# Patient Record
Sex: Male | Born: 1955 | Race: White | Hispanic: No | Smoking: Former smoker
Health system: Southern US, Community
[De-identification: ages and names within clinical notes are randomized; demographics above are authoritative.]

## PROBLEM LIST (undated history)

## (undated) DIAGNOSIS — I351 Nonrheumatic aortic (valve) insufficiency: Secondary | ICD-10-CM

## (undated) DIAGNOSIS — F1011 Alcohol abuse, in remission: Secondary | ICD-10-CM

## (undated) DIAGNOSIS — I350 Nonrheumatic aortic (valve) stenosis: Secondary | ICD-10-CM

## (undated) DIAGNOSIS — J449 Chronic obstructive pulmonary disease, unspecified: Secondary | ICD-10-CM

## (undated) DIAGNOSIS — I5082 Biventricular heart failure: Secondary | ICD-10-CM

## (undated) HISTORY — PX: OTHER SURGICAL HISTORY: SHX169

## (undated) HISTORY — DX: Biventricular heart failure: I50.82

## (undated) HISTORY — DX: Nonrheumatic aortic (valve) stenosis: I35.0

## (undated) HISTORY — DX: Nonrheumatic aortic (valve) insufficiency: I35.1

## (undated) HISTORY — DX: Chronic obstructive pulmonary disease, unspecified: J44.9

## (undated) HISTORY — DX: Alcohol abuse, in remission: F10.11

---

## 2010-02-27 ENCOUNTER — Emergency Department (HOSPITAL_COMMUNITY)
Admission: EM | Admit: 2010-02-27 | Discharge: 2010-02-27 | Payer: Self-pay | Source: Home / Self Care | Admitting: Emergency Medicine

## 2010-07-27 ENCOUNTER — Inpatient Hospital Stay (HOSPITAL_COMMUNITY)
Admission: EM | Admit: 2010-07-27 | Discharge: 2010-08-10 | Payer: Self-pay | Source: Home / Self Care | Admitting: Emergency Medicine

## 2010-07-27 ENCOUNTER — Ambulatory Visit: Payer: Self-pay | Admitting: Cardiology

## 2010-07-28 ENCOUNTER — Encounter (INDEPENDENT_AMBULATORY_CARE_PROVIDER_SITE_OTHER): Payer: Self-pay | Admitting: Internal Medicine

## 2010-08-01 ENCOUNTER — Ambulatory Visit: Payer: Self-pay | Admitting: Cardiovascular Disease

## 2010-08-02 ENCOUNTER — Other Ambulatory Visit: Payer: Self-pay | Admitting: Internal Medicine

## 2010-08-02 ENCOUNTER — Other Ambulatory Visit: Payer: Self-pay | Admitting: Cardiovascular Disease

## 2010-08-03 ENCOUNTER — Encounter: Payer: Self-pay | Admitting: Cardiology

## 2010-08-03 ENCOUNTER — Ambulatory Visit: Payer: Self-pay | Admitting: Surgery

## 2010-08-03 ENCOUNTER — Other Ambulatory Visit: Payer: Self-pay | Admitting: Cardiovascular Disease

## 2010-08-03 ENCOUNTER — Encounter: Payer: Self-pay | Admitting: Surgery

## 2010-08-04 ENCOUNTER — Other Ambulatory Visit: Payer: Self-pay | Admitting: Cardiovascular Disease

## 2010-08-05 ENCOUNTER — Other Ambulatory Visit: Payer: Self-pay | Admitting: Internal Medicine

## 2010-08-07 ENCOUNTER — Other Ambulatory Visit: Payer: Self-pay | Admitting: Internal Medicine

## 2010-08-08 ENCOUNTER — Other Ambulatory Visit: Payer: Self-pay | Admitting: Cardiovascular Disease

## 2010-08-09 ENCOUNTER — Other Ambulatory Visit: Payer: Self-pay | Admitting: Internal Medicine

## 2010-08-09 ENCOUNTER — Other Ambulatory Visit: Payer: Self-pay | Admitting: Cardiovascular Disease

## 2010-08-10 ENCOUNTER — Other Ambulatory Visit: Payer: Self-pay | Admitting: Cardiovascular Disease

## 2010-08-21 ENCOUNTER — Ambulatory Visit: Payer: Self-pay | Admitting: Cardiology

## 2010-08-21 DIAGNOSIS — J449 Chronic obstructive pulmonary disease, unspecified: Secondary | ICD-10-CM | POA: Insufficient documentation

## 2010-08-21 DIAGNOSIS — J4489 Other specified chronic obstructive pulmonary disease: Secondary | ICD-10-CM | POA: Insufficient documentation

## 2010-08-21 DIAGNOSIS — I359 Nonrheumatic aortic valve disorder, unspecified: Secondary | ICD-10-CM | POA: Insufficient documentation

## 2010-08-21 DIAGNOSIS — I429 Cardiomyopathy, unspecified: Secondary | ICD-10-CM

## 2010-08-22 ENCOUNTER — Encounter: Payer: Self-pay | Admitting: Cardiology

## 2010-08-25 ENCOUNTER — Encounter: Payer: Self-pay | Admitting: Cardiology

## 2010-08-26 LAB — CONVERTED CEMR LAB
BUN: 16 mg/dL (ref 6–23)
CO2: 27 meq/L (ref 19–32)
Calcium: 9.9 mg/dL (ref 8.4–10.5)
Chloride: 94 meq/L — ABNORMAL LOW (ref 96–112)
Creatinine, Ser: 0.91 mg/dL (ref 0.40–1.50)
Glucose, Bld: 124 mg/dL — ABNORMAL HIGH (ref 70–99)
Potassium: 4.7 meq/L (ref 3.5–5.3)
Pro B Natriuretic peptide (BNP): 2632.8 pg/mL — ABNORMAL HIGH (ref 0.0–100.0)
Sodium: 136 meq/L (ref 135–145)

## 2010-08-28 ENCOUNTER — Telehealth (INDEPENDENT_AMBULATORY_CARE_PROVIDER_SITE_OTHER): Payer: Self-pay | Admitting: *Deleted

## 2010-08-28 ENCOUNTER — Encounter: Payer: Self-pay | Admitting: Cardiology

## 2010-09-05 ENCOUNTER — Ambulatory Visit: Payer: Self-pay | Admitting: Cardiology

## 2010-09-08 LAB — CONVERTED CEMR LAB
BUN: 21 mg/dL (ref 6–23)
CO2: 31 meq/L (ref 19–32)
Calcium: 9.5 mg/dL (ref 8.4–10.5)
Chloride: 92 meq/L — ABNORMAL LOW (ref 96–112)
Creatinine, Ser: 1.03 mg/dL (ref 0.40–1.50)
Glucose, Bld: 96 mg/dL (ref 70–99)
Potassium: 4.4 meq/L (ref 3.5–5.3)
Pro B Natriuretic peptide (BNP): 3635.8 pg/mL — ABNORMAL HIGH (ref 0.0–100.0)
Sodium: 133 meq/L — ABNORMAL LOW (ref 135–145)

## 2010-09-14 ENCOUNTER — Ambulatory Visit: Payer: Self-pay | Admitting: Cardiology

## 2010-09-14 DIAGNOSIS — I5022 Chronic systolic (congestive) heart failure: Secondary | ICD-10-CM | POA: Insufficient documentation

## 2010-10-02 ENCOUNTER — Telehealth (INDEPENDENT_AMBULATORY_CARE_PROVIDER_SITE_OTHER): Payer: Self-pay

## 2010-10-02 ENCOUNTER — Encounter: Payer: Self-pay | Admitting: Adult Health

## 2010-10-11 ENCOUNTER — Telehealth (INDEPENDENT_AMBULATORY_CARE_PROVIDER_SITE_OTHER): Payer: Self-pay

## 2010-10-12 DIAGNOSIS — R0602 Shortness of breath: Secondary | ICD-10-CM | POA: Insufficient documentation

## 2010-10-13 ENCOUNTER — Ambulatory Visit (HOSPITAL_COMMUNITY)
Admission: RE | Admit: 2010-10-13 | Discharge: 2010-10-13 | Payer: Self-pay | Source: Home / Self Care | Attending: Cardiology | Admitting: Cardiology

## 2010-10-15 LAB — CONVERTED CEMR LAB
AST: 19 units/L (ref 0–37)
Albumin: 4.5 g/dL (ref 3.5–5.2)
HDL: 41 mg/dL (ref >39)
LDL Cholesterol: 100 mg/dL — ABNORMAL HIGH (ref 0–99)
Total Bilirubin: 1 mg/dL (ref 0.3–1.2)
Total CHOL/HDL Ratio: 4

## 2010-10-16 LAB — CONVERTED CEMR LAB
CO2: 28 meq/L (ref 19–32)
Calcium: 10 mg/dL (ref 8.4–10.5)
Chloride: 94 meq/L — ABNORMAL LOW (ref 96–112)
Eosinophils Relative: 2 % (ref 0–5)
Glucose, Bld: 142 mg/dL — ABNORMAL HIGH (ref 70–99)
HCT: 44.2 % (ref 39.0–52.0)
Hemoglobin: 14.4 g/dL (ref 13.0–17.0)
Lymphocytes Relative: 20 % (ref 12–46)
Lymphs Abs: 1.7 10*3/uL (ref 0.7–4.0)
Neutro Abs: 6 10*3/uL (ref 1.7–7.7)
Platelets: 194 10*3/uL (ref 150–400)
Sodium: 136 meq/L (ref 135–145)
WBC: 8.4 10*3/uL (ref 4.0–10.5)

## 2010-10-20 ENCOUNTER — Encounter (INDEPENDENT_AMBULATORY_CARE_PROVIDER_SITE_OTHER): Payer: Self-pay | Admitting: *Deleted

## 2010-10-20 ENCOUNTER — Ambulatory Visit: Payer: Self-pay | Admitting: Cardiology

## 2010-11-05 HISTORY — PX: AORTIC VALVE REPLACEMENT: SHX41

## 2010-11-13 ENCOUNTER — Encounter: Payer: Self-pay | Admitting: Cardiology

## 2010-11-13 LAB — CONVERTED CEMR LAB
BUN: 21 mg/dL (ref 6–23)
CO2: 24 meq/L (ref 19–32)
Chloride: 96 meq/L (ref 96–112)
Creatinine, Ser: 1.19 mg/dL (ref 0.40–1.50)
Glucose, Bld: 117 mg/dL — ABNORMAL HIGH (ref 70–99)

## 2010-11-14 ENCOUNTER — Emergency Department (HOSPITAL_COMMUNITY)
Admission: EM | Admit: 2010-11-14 | Discharge: 2010-11-15 | Disposition: A | Discharge status: Other Acute Inpatient Hospital | Payer: Self-pay | Source: Home / Self Care | Admitting: Emergency Medicine

## 2010-11-15 ENCOUNTER — Encounter: Payer: Self-pay | Admitting: Cardiology

## 2010-11-15 ENCOUNTER — Inpatient Hospital Stay (HOSPITAL_COMMUNITY)
Admission: EM | Admit: 2010-11-15 | Discharge: 2010-11-18 | Disposition: A | Discharge status: Other Acute Inpatient Hospital | Payer: Self-pay | Attending: Cardiovascular Disease | Admitting: Cardiovascular Disease

## 2010-11-20 LAB — URINALYSIS, ROUTINE W REFLEX MICROSCOPIC
Bilirubin Urine: NEGATIVE
Hgb urine dipstick: NEGATIVE
Ketones, ur: NEGATIVE mg/dL
Leukocytes, UA: NEGATIVE
Nitrite: NEGATIVE
Specific Gravity, Urine: 1.005 (ref 1.005–1.030)
Urine Glucose, Fasting: NEGATIVE mg/dL
Urobilinogen, UA: 0.2 mg/dL (ref 0.0–1.0)
pH: 5 (ref 5.0–8.0)

## 2010-11-20 LAB — COMPREHENSIVE METABOLIC PANEL
ALT: 116 U/L — ABNORMAL HIGH (ref 0–53)
ALT: 156 U/L — ABNORMAL HIGH (ref 0–53)
ALT: 326 U/L — ABNORMAL HIGH (ref 0–53)
ALT: 232 U/L — ABNORMAL HIGH (ref 0–53)
ALT: 295 U/L — ABNORMAL HIGH (ref 0–53)
AST: 110 U/L — ABNORMAL HIGH (ref 0–37)
AST: 156 U/L — ABNORMAL HIGH (ref 0–37)
AST: 290 U/L — ABNORMAL HIGH (ref 0–37)
AST: 125 U/L — ABNORMAL HIGH (ref 0–37)
AST: 181 U/L — ABNORMAL HIGH (ref 0–37)
Albumin: 4.3 g/dL (ref 3.5–5.2)
Albumin: 4 g/dL (ref 3.5–5.2)
Albumin: 4.2 g/dL (ref 3.5–5.2)
Albumin: 3.3 g/dL — ABNORMAL LOW (ref 3.5–5.2)
Albumin: 3.7 g/dL (ref 3.5–5.2)
Alkaline Phosphatase: 63 U/L (ref 39–117)
Alkaline Phosphatase: 58 U/L (ref 39–117)
Alkaline Phosphatase: 58 U/L (ref 39–117)
Alkaline Phosphatase: 51 U/L (ref 39–117)
Alkaline Phosphatase: 62 U/L (ref 39–117)
BUN: 44 mg/dL — ABNORMAL HIGH (ref 6–23)
BUN: 40 mg/dL — ABNORMAL HIGH (ref 6–23)
BUN: 44 mg/dL — ABNORMAL HIGH (ref 6–23)
BUN: 13 mg/dL (ref 6–23)
BUN: 19 mg/dL (ref 6–23)
CO2: 25 mEq/L (ref 19–32)
CO2: 22 mEq/L (ref 19–32)
CO2: 22 mEq/L (ref 19–32)
CO2: 27 mEq/L (ref 19–32)
CO2: 28 mEq/L (ref 19–32)
Calcium: 10 mg/dL (ref 8.4–10.5)
Calcium: 9.1 mg/dL (ref 8.4–10.5)
Calcium: 9.3 mg/dL (ref 8.4–10.5)
Calcium: 8.1 mg/dL — ABNORMAL LOW (ref 8.4–10.5)
Calcium: 8.7 mg/dL (ref 8.4–10.5)
Chloride: 92 mEq/L — ABNORMAL LOW (ref 96–112)
Chloride: 93 mEq/L — ABNORMAL LOW (ref 96–112)
Chloride: 95 mEq/L — ABNORMAL LOW (ref 96–112)
Chloride: 89 mEq/L — ABNORMAL LOW (ref 96–112)
Chloride: 97 mEq/L (ref 96–112)
Creatinine, Ser: 1.41 mg/dL (ref 0.4–1.5)
Creatinine, Ser: 1.58 mg/dL — ABNORMAL HIGH (ref 0.4–1.5)
Creatinine, Ser: 1.58 mg/dL — ABNORMAL HIGH (ref 0.4–1.5)
Creatinine, Ser: 0.86 mg/dL (ref 0.4–1.5)
Creatinine, Ser: 0.97 mg/dL (ref 0.4–1.5)
GFR calc Af Amer: >60 mL/min (ref >60)
GFR calc Af Amer: 56 mL/min — ABNORMAL LOW (ref >60)
GFR calc Af Amer: 56 mL/min — ABNORMAL LOW (ref >60)
GFR calc Af Amer: >60 mL/min (ref >60)
GFR calc Af Amer: >60 mL/min (ref >60)
GFR calc non Af Amer: 52 mL/min — ABNORMAL LOW (ref >60)
GFR calc non Af Amer: 46 mL/min — ABNORMAL LOW (ref >60)
GFR calc non Af Amer: 46 mL/min — ABNORMAL LOW (ref >60)
GFR calc non Af Amer: >60 mL/min (ref >60)
GFR calc non Af Amer: >60 mL/min (ref >60)
Glucose, Bld: 96 mg/dL (ref 70–99)
Glucose, Bld: 111 mg/dL — ABNORMAL HIGH (ref 70–99)
Glucose, Bld: 86 mg/dL (ref 70–99)
Glucose, Bld: 341 mg/dL — ABNORMAL HIGH (ref 70–99)
Glucose, Bld: 99 mg/dL (ref 70–99)
Potassium: 5.7 mEq/L — ABNORMAL HIGH (ref 3.5–5.1)
Potassium: 3 mEq/L — ABNORMAL LOW (ref 3.5–5.1)
Potassium: 5.8 mEq/L — ABNORMAL HIGH (ref 3.5–5.1)
Potassium: 3.1 mEq/L — ABNORMAL LOW (ref 3.5–5.1)
Potassium: 3.5 mEq/L (ref 3.5–5.1)
Sodium: 129 mEq/L — ABNORMAL LOW (ref 135–145)
Sodium: 128 mEq/L — ABNORMAL LOW (ref 135–145)
Sodium: 128 mEq/L — ABNORMAL LOW (ref 135–145)
Sodium: 126 mEq/L — ABNORMAL LOW (ref 135–145)
Sodium: 135 mEq/L (ref 135–145)
Total Bilirubin: 2.4 mg/dL — ABNORMAL HIGH (ref 0.3–1.2)
Total Bilirubin: 1.9 mg/dL — ABNORMAL HIGH (ref 0.3–1.2)
Total Bilirubin: 2.7 mg/dL — ABNORMAL HIGH (ref 0.3–1.2)
Total Bilirubin: 1.9 mg/dL — ABNORMAL HIGH (ref 0.3–1.2)
Total Bilirubin: 2 mg/dL — ABNORMAL HIGH (ref 0.3–1.2)
Total Protein: 6.6 g/dL (ref 6.0–8.3)
Total Protein: 6.3 g/dL (ref 6.0–8.3)
Total Protein: 6.6 g/dL (ref 6.0–8.3)
Total Protein: 5.7 g/dL — ABNORMAL LOW (ref 6.0–8.3)
Total Protein: 6 g/dL (ref 6.0–8.3)

## 2010-11-20 LAB — CBC
HCT: 41.2 % (ref 39.0–52.0)
HCT: 41.3 % (ref 39.0–52.0)
HCT: 42 % (ref 39.0–52.0)
HCT: 37.5 % — ABNORMAL LOW (ref 39.0–52.0)
Hemoglobin: 14.4 g/dL (ref 13.0–17.0)
Hemoglobin: 14.3 g/dL (ref 13.0–17.0)
Hemoglobin: 14.6 g/dL (ref 13.0–17.0)
Hemoglobin: 13 g/dL (ref 13.0–17.0)
MCH: 31.1 pg (ref 26.0–34.0)
MCH: 31.2 pg (ref 26.0–34.0)
MCH: 31.3 pg (ref 26.0–34.0)
MCH: 31.5 pg (ref 26.0–34.0)
MCHC: 35 g/dL (ref 30.0–36.0)
MCHC: 34.6 g/dL (ref 30.0–36.0)
MCHC: 34.8 g/dL (ref 30.0–36.0)
MCHC: 34.7 g/dL (ref 30.0–36.0)
MCV: 89 fL (ref 78.0–100.0)
MCV: 90.1 fL (ref 78.0–100.0)
MCV: 90.2 fL (ref 78.0–100.0)
MCV: 90.8 fL (ref 78.0–100.0)
Platelets: 195 10*3/uL (ref 150–400)
Platelets: 175 10*3/uL (ref 150–400)
Platelets: 186 10*3/uL (ref 150–400)
Platelets: 159 10*3/uL (ref 150–400)
RBC: 4.63 MIL/uL (ref 4.22–5.81)
RBC: 4.58 MIL/uL (ref 4.22–5.81)
RBC: 4.66 MIL/uL (ref 4.22–5.81)
RBC: 4.13 MIL/uL — ABNORMAL LOW (ref 4.22–5.81)
RDW: 16.8 % — ABNORMAL HIGH (ref 11.5–15.5)
RDW: 16.8 % — ABNORMAL HIGH (ref 11.5–15.5)
RDW: 16.9 % — ABNORMAL HIGH (ref 11.5–15.5)
RDW: 17.4 % — ABNORMAL HIGH (ref 11.5–15.5)
WBC: 10.7 10*3/uL — ABNORMAL HIGH (ref 4.0–10.5)
WBC: 12.1 10*3/uL — ABNORMAL HIGH (ref 4.0–10.5)
WBC: 12.2 10*3/uL — ABNORMAL HIGH (ref 4.0–10.5)
WBC: 9.7 10*3/uL (ref 4.0–10.5)

## 2010-11-20 LAB — DIFFERENTIAL
Basophils Absolute: 0.1 10*3/uL (ref 0.0–0.1)
Basophils Absolute: 0 10*3/uL (ref 0.0–0.1)
Basophils Relative: 1 % (ref 0–1)
Basophils Relative: 0 % (ref 0–1)
Eosinophils Absolute: 0.1 10*3/uL (ref 0.0–0.7)
Eosinophils Absolute: 0.1 10*3/uL (ref 0.0–0.7)
Eosinophils Relative: 1 % (ref 0–5)
Eosinophils Relative: 0 % (ref 0–5)
Lymphocytes Relative: 16 % (ref 12–46)
Lymphocytes Relative: 11 % — ABNORMAL LOW (ref 12–46)
Lymphs Abs: 1.7 10*3/uL (ref 0.7–4.0)
Lymphs Abs: 1.3 10*3/uL (ref 0.7–4.0)
Monocytes Absolute: 1.1 10*3/uL — ABNORMAL HIGH (ref 0.1–1.0)
Monocytes Absolute: 1.3 10*3/uL — ABNORMAL HIGH (ref 0.1–1.0)
Monocytes Relative: 10 % (ref 3–12)
Monocytes Relative: 11 % (ref 3–12)
Neutro Abs: 7.7 10*3/uL (ref 1.7–7.7)
Neutro Abs: 9.5 10*3/uL — ABNORMAL HIGH (ref 1.7–7.7)
Neutrophils Relative %: 73 % (ref 43–77)
Neutrophils Relative %: 78 % — ABNORMAL HIGH (ref 43–77)

## 2010-11-20 LAB — BASIC METABOLIC PANEL
BUN: 43 mg/dL — ABNORMAL HIGH (ref 6–23)
BUN: 26 mg/dL — ABNORMAL HIGH (ref 6–23)
CO2: 23 mEq/L (ref 19–32)
CO2: 26 mEq/L (ref 19–32)
Calcium: 9.6 mg/dL (ref 8.4–10.5)
Calcium: 8.7 mg/dL (ref 8.4–10.5)
Chloride: 94 mEq/L — ABNORMAL LOW (ref 96–112)
Chloride: 95 mEq/L — ABNORMAL LOW (ref 96–112)
Creatinine, Ser: 1.63 mg/dL — ABNORMAL HIGH (ref 0.4–1.5)
Creatinine, Ser: 1.16 mg/dL (ref 0.4–1.5)
GFR calc Af Amer: 54 mL/min — ABNORMAL LOW (ref >60)
GFR calc Af Amer: >60 mL/min (ref >60)
GFR calc non Af Amer: 44 mL/min — ABNORMAL LOW (ref >60)
GFR calc non Af Amer: >60 mL/min (ref >60)
Glucose, Bld: 67 mg/dL — ABNORMAL LOW (ref 70–99)
Glucose, Bld: 115 mg/dL — ABNORMAL HIGH (ref 70–99)
Potassium: 4.7 mEq/L (ref 3.5–5.1)
Potassium: 3.3 mEq/L — ABNORMAL LOW (ref 3.5–5.1)
Sodium: 130 mEq/L — ABNORMAL LOW (ref 135–145)
Sodium: 132 mEq/L — ABNORMAL LOW (ref 135–145)

## 2010-11-20 LAB — HEPATITIS PANEL, ACUTE
HCV Ab: NEGATIVE
Hep A IgM: NEGATIVE
Hep B C IgM: NEGATIVE
Hepatitis B Surface Ag: NEGATIVE

## 2010-11-20 LAB — GLUCOSE, CAPILLARY: Glucose-Capillary: 115 mg/dL — ABNORMAL HIGH (ref 70–99)

## 2010-11-20 LAB — SODIUM, URINE, RANDOM: Sodium, Ur: 69 mEq/L

## 2010-11-20 LAB — AMMONIA: Ammonia: 49 umol/L — ABNORMAL HIGH (ref 11–35)

## 2010-11-20 LAB — CARDIAC PANEL(CRET KIN+CKTOT+MB+TROPI)
CK, MB: 1.4 ng/mL (ref 0.3–4.0)
CK, MB: 1.6 ng/mL (ref 0.3–4.0)
Relative Index: INVALID (ref 0.0–2.5)
Relative Index: INVALID (ref 0.0–2.5)
Total CK: 40 U/L (ref 7–232)
Total CK: 34 U/L (ref 7–232)
Troponin I: 0.04 ng/mL (ref 0.00–0.06)
Troponin I: 0.04 ng/mL (ref 0.00–0.06)

## 2010-11-20 LAB — URINE MICROSCOPIC-ADD ON

## 2010-11-20 LAB — LIPASE, BLOOD: Lipase: 19 U/L (ref 11–59)

## 2010-11-20 LAB — TSH: TSH: 2.753 u[IU]/mL (ref 0.350–4.500)

## 2010-11-20 LAB — LIPID PANEL
Cholesterol: 94 mg/dL (ref 0–200)
HDL: 27 mg/dL — ABNORMAL LOW (ref >39)
LDL Cholesterol: 57 mg/dL (ref 0–99)
Total CHOL/HDL Ratio: 3.5 RATIO
Triglycerides: 48 mg/dL (ref <150)
VLDL: 10 mg/dL (ref 0–40)

## 2010-11-20 LAB — CK TOTAL AND CKMB (NOT AT ARMC)
CK, MB: 1.5 ng/mL (ref 0.3–4.0)
Relative Index: INVALID (ref 0.0–2.5)
Total CK: 45 U/L (ref 7–232)

## 2010-11-20 LAB — MAGNESIUM
Magnesium: 2 mg/dL (ref 1.5–2.5)
Magnesium: 2.1 mg/dL (ref 1.5–2.5)
Magnesium: 2.2 mg/dL (ref 1.5–2.5)

## 2010-11-20 LAB — CREATININE, URINE, RANDOM: Creatinine, Urine: 25 mg/dL

## 2010-11-20 LAB — LACTIC ACID, PLASMA: Lactic Acid, Venous: 1.2 mmol/L (ref 0.5–2.2)

## 2010-11-20 LAB — ACETAMINOPHEN LEVEL: Acetaminophen (Tylenol), Serum: <10 ug/mL — ABNORMAL LOW (ref 10–30)

## 2010-11-20 LAB — CORTISOL: Cortisol, Plasma: 40.1 ug/dL

## 2010-11-20 LAB — MRSA PCR SCREENING: MRSA by PCR: NEGATIVE

## 2010-11-20 LAB — PHOSPHORUS: Phosphorus: 4.2 mg/dL (ref 2.3–4.6)

## 2010-11-20 LAB — PROTIME-INR
INR: 1.75 — ABNORMAL HIGH (ref 0.00–1.49)
Prothrombin Time: 20.6 seconds — ABNORMAL HIGH (ref 11.6–15.2)

## 2010-11-20 LAB — TROPONIN I: Troponin I: 0.04 ng/mL (ref 0.00–0.06)

## 2010-11-20 LAB — OSMOLALITY, URINE: Osmolality, Ur: 322 mOsm/kg — ABNORMAL LOW (ref 390–1090)

## 2010-11-22 ENCOUNTER — Encounter (INDEPENDENT_AMBULATORY_CARE_PROVIDER_SITE_OTHER): Payer: Self-pay | Admitting: *Deleted

## 2010-11-22 ENCOUNTER — Ambulatory Visit: Admit: 2010-11-22 | Payer: Self-pay | Admitting: Cardiology

## 2010-11-22 LAB — CULTURE, BLOOD (ROUTINE X 2)
Culture  Setup Time: 201201111547
Culture  Setup Time: 201201111548
Culture: NO GROWTH
Culture: NO GROWTH

## 2010-11-27 NOTE — Consult Note (Addendum)
NAMEJASMINE, MACEACHERN NO.:  [id number]  MEDICAL RECORD NO.:  [id number]          PATIENT TYPE:  INP  LOCATION:  2503                         FACILITY:  [facility]  PHYSICIAN:  Vesta Mixer, M.D. DATE OF BIRTH:  04-09-1956  DATE OF CONSULTATION:  11/15/2010 DATE OF DISCHARGE:                                CONSULTATION   PRIMARY CARDIOLOGIST:  Jonelle Sidle, MD  PRIMARY CARE PROVIDER:  The patient has been referred to [facility] but has not yet followed up.  HISTORY OF PRESENT ILLNESS:  Mr. Sweeden is a 55 year old gentleman whom we first became familiar was in September 2011 when he came in with shortness of breath.  He was diagnosed with the nonischemic cardiomyopathy at that time with an EF of 15% by TEE and 20% by cath. Cardiac cath revealed no severe coronary artery disease.  Discharge weight was 59.2 kg.  He also had moderately severe bicuspid aortic stenosis and was seen in consultation by Dr. Laneta Simmers and felt high risk for surgery, and turned down.  He has a history of significant alcohol use but states he quit after diagnosis of heart disease.  He presents to [facility] with 4-5 day progressive abdominal pain/fullness as well as decreased appetite and nonbloody nausea and vomiting.  He had not noted any change in weight but does not weigh himself consistently. He also notes an increase in shortness of breath and dyspnea on exertion with most activities.  He has been compliant with his medications and tries to avoid salt in his diet.  He had some chest pressure yesterday. He also endorses decreased urine output over the past few days and is on Lasix 80 mg p.o. b.i.d. at home.  He has multiple laboratory abnormalities on admission including hyponatremia, BUN, and creatinine, they are elevated up to 1.63 today, elevated LFTs, elevated ammonia.  He had negative cardiac enzymes.  Initial plain films indicated that  there was a small to moderate right pleural effusion and CT of abdomen and pelvis suggest moderate ascites, increased on prior study.  He went down for a paracentesis today but initial ultrasound of the abdomen showed small amount of ascites in all 4 quadrants and therefore there was insufficient peritoneal fluid to perform a paracentesis.  He was empirically started by the primary team on Rocephin for question of SVT. Most of his cardiac medications have been on hold today secondary to hypotension.  Blood pressure earlier was 83/51 and his last pressure was 118/53.  PAST MEDICAL HISTORY: 1. 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 2. No significant coronary artery disease by cath August 03, 2010,     some emphysema. 3. Prior history of alcohol abuse. 4. Prior history of tobacco abuse. 5. Moderately severe bicuspid aortic stenosis with an AV of 1.23 cm2     by TE with a gradient of 23 mmHg.  He also has mild TR and mild MR  and moderate AI. 6. Hypotension.  PAST SURGICAL HISTORY:  Remotely he states he had some sort of knots removed from his chest pain.  INPATIENT MEDICATIONS:  Aspirin 325 mg daily, Rocephin 1 gram IV q.24 h., vitamin B12, folic acid 1 mg daily, NovoLog/Kayexalate/calcium gluconate for elevated potassium of 5.8 on admission with a recheck of 4.7, multivitamin, thiamine 100 mg daily.  OUTPATIENT MEDICATIONS:  Lisinopril 2.5 mg a day, Spiriva 18 mcg inhaled daily, Coreg 3.125 mg b.i.d., Lasix 80 mg b.i.d., spironolactone 25 mg a day, multivitamin, aspirin 325 mg daily, potassium chloride 20 mEq daily, folic acid, thiamine, vitamin B12.  ALLERGIES:  No known drug allergies.  SOCIAL HISTORY:  Mr. Colasanti lives in [place] by himself.  He has no children.  He quit smoking approximately 1 year ago and denies any alcohol use since  08/07/2010.  He smokes marijuana occasionally but denies any cocaine, heroin, or methamphetamines.  FAMILY HISTORY:  Mother died of an old age of pneumonia.  Father died of cancer.  He had a brother that had a history of MI.  REVIEW OF SYSTEMS:  Positive for shortness of breath and dyspnea on exertion.  No lower extremity edema, palpitations, syncope, presyncope, cough, bright red blood per rectum, melena, hematemesis.  The HPI for pertinent positives.  All other systems reviewed and otherwise negative.  LABORATORY DATA:  WBC 12.2, hemoglobin 14.3, hematocrit 41.3, platelet count 175,000.  Sodium 130, potassium 4.7, chloride 95, CO2 23, glucose 67 this morning, BUN 43, creatinine 1.3, total bilirubin 2.7, alk phos 58, AST 156, ALT 166, total protein 6.6, albumin 4.2.  Cardiac enzymes negative x3.  PT is elevated at 20.6 and INR is elevated at 1.75, lipase is 19.  Ammonia is elevated at 49 with a lactic acid of 1.2, magnesium 2.0, total cholesterol 94, triglycerides 48, HDL 27, LDL 57.  STUDIES: 1. Abdominal ultrasound 12/05/10 showed small amount of     ascites in all 4 quadrants and paracentesis is not performed. 2. Chest x-ray December 05, 2010 showed small to moderate right pleural     effusions and basilar airspace disease.  Cardiomegaly without     edema. 3. CT abdomen and pelvis, 12-05-10 showed moderate ascites,     increased from prior studies.  Cardiomegaly with decrease in     moderate right pleural lesion.  Mild bilateral renal cortical     atrophy.  EKG:  Normal sinus rhythm with ST changes in V2-V3 which is similar from prior EKG with T-wave inversion noted in V5-V6 which is slightly more pronounced than Aug 07, 2010.  PHYSICAL EXAMINATION:  VITAL SIGNS:  Temperature 97.8, pulse 88, respirations 17, blood pressure 83/51 as well as recheck of 118 systolic, pulse ox 95% on 2 liters. GENERAL:  This is a pleasant well-nourished appearing white male  in no acute distress. HEENT:  Normocephalic, atraumatic with extraocular movements intact. Sclerae and nares without discharge.  Dentition is poor. NECK:  Supple with elevated JVP to 10-15 cm. HEART:  Auscultation of heart reveals a 2/6 systolic ejection murmur with a soft diastolic murmur. LUNGS:  Generally clear throughout without wheezes, rales, or rhonchi. ABDOMEN:  Soft, round, nontender with normoactive bowel sounds. EXTREMITIES:  Warm, dry, and without edema.  He has 1+ pedal pulses and 2+ radial pulses. NEUROLOGICAL:  He is alert and oriented x3 and responds to questions appropriately with a normal affect.  IMPRESSION/PLAN:  The patient was seen and examined by Dr. Elease Hashimoto and myself in  unit 2500.  This is a 55 year old gentleman with a history of nonischemic cardiomyopathy with an EF of 15-20% with moderately severe bicuspid aortic stenosis and was previously turned down for aortic valve replacement.  He presents today with abdominal pain and shortness of breath with elevated LFTs, ammonia, and coag.  Examination revealed signs of right heart failure, although this is limited to his abdomen and jugular venous pressure.  He has no lower extremity edema.  His lungs are fairly clear.  Cardiac enzymes have been negative.  His renal function has continued to decline since he initially presented to the ER last night.  He has been empirically started on Rocephin for question of peritonitis and has a white blood cell count of 12.2.  He has some features of hypoperfusion including his declining renal function.  We feel all these are most likely related to hepatic congestion at this time.  Unfortunately, his blood pressure has limited readdition of many of his cardiac medication.  At this time, we will transfer him to the unit and initiate milrinone to see if we can improve his cardiac output.  His blood pressure will be need to be reassessed for the addition of diuretics. We would hold  off on ACE inhibitor and spironolactone now until his kidney function improves.  His beta-blocker may be added back if his blood pressure remained stable.  Overall we feel he has a fairly poor prognosis.  We will continue to follow.  Thank you for the opportunity to participate in care of this patient.     Ronie Spies, P.A.C.   ______________________________ Vesta Mixer, M.D.    DD/MEDQ  D:  11/15/2010  T:  11/16/2010  Job:  [id number]  cc:   Jonelle Sidle, MD  Electronically Signed by Ronie Spies  on 11/27/2010 10:35:37 AM Electronically Signed by Kristeen Miss M.D. on 12/05/2010 12:14:31 PM

## 2010-12-05 NOTE — Letter (Signed)
Summary: [place] Results Engineer, agricultural at [facility]  [address] [place] [address]   Phone: [phone]  Fax: [phone]      August 28, 2010 MRN: [id number]   [address], [facility] [place], [place]  [address]   Dear Mr. LITAKER,  Your test ordered by Selena Batten has been reviewed by your physician (or physician assistant) and was found to be normal or stable. Your physician (or physician assistant) felt no changes were needed at this time.  ____ Echocardiogram  ____ Cardiac Stress Test  __X__ Lab Work  ____ Peripheral vascular study of arms, legs or neck  ____ CT scan or X-ray  ____ Lung or Breathing test  ____ Other:  Please continue on current medical treatment and monitor daily weights as requested at last office visit with Dr. Diona Browner Thank you.   Nona Dell, MD, F.A.C.C

## 2010-12-05 NOTE — Progress Notes (Signed)
Summary: walk in triage       Additional Follow-up for Phone Call Additional follow up Details #2::    08/25/10- Benjamin [name] came to the office with complaints of right rib pain on inspiration and a generalized discomfort in his epigastric area especially after eating. Lungs clear to auscultation, asked pt to get omeprazole 20mg  daily otc and come back to office on Monday Teressa Lower RN  August 25 2010 4:32 PM   Pt came back to office, omeprazole helped symptoms some, but pain in ribs remain on inspiration.  Appt made for pt at [facility] for 08/30/10 1:15pm    Tammy Sanders RN  August 28, 2010 11:34 AM  Follow-up by: Teressa Lower RN,  August 28, 2010 11:39 AM

## 2010-12-05 NOTE — Progress Notes (Signed)
Summary: RX REFILL AND FLUID IN CHEST  Phone Note Call from Patient Call back at Home Phone [phone]   Caller: PT Reason for Call: Talk to Nurse Summary of Call: PT WALKED IN THIS AM WANTING TO KNOW WHAT ELSE HE CAN DO BECAUSE HE STILL FEELS LIKE HE HAS FLUID IN HIS CHEST. HE IS SCEDULED FOR A NURSE VISIT NEXT WEEK. HE STILL FEELS SHORT WINDED AND TIRED. ( WAS NOT HAVING HARD TIME BREATHING WHEN HE WAS HERE)  HE ALSO WOULD LIKE A REFILL ON FOLIC ACID 1MG  1 TAB BY MOUTH DAILY. Initial call taken by: Faythe Ghee,  October 11, 2010 8:54 AM  Follow-up for Phone Call        S: Pt. states that he thinks he has gained about 4lbs. He states he has a "cheap" set of scales at home (he does not know what his weight was before today so he is not sure if he has gained weight or not). B: On last OV with Dr. Diona Browner on 11-10 pt. was advised to continue lasix dose of 80mg  by mouth two times a day. Pt. also takes Spironolactone 12.5mg  once daily. Pt. called on 11-28 (see phone note) with c/o 4lb. weight gain and was advised by Dr. Diona Browner to consume no more than 2 g sodium a day or 2 L of fluids a day and to take 1 and 1/2 tablets (120 mg) of Lasix in the morning and 1 tablet (80mg ) in the evenings then resume 80mg  bid. A: Pt. c/o SOB, fatigue and weakness but denies swelling at this time. He wants to know if he should increase Lasix dose again. Please advise. R: Pt. advised that he needs to have a PMD as all of his problems may not be cardiac related. He states he is waiting to find out about getting disabilty and he cant afford at this time. Pt. advised that we will call him with Joni Reining, NP's recommendations.   Follow-up by: Larita Fife Via LPN,  October 11, 2010 2:47 PM  New Problems: DYSPNEA (ICD-786.05)   New Problems: DYSPNEA (ICD-786.05) Prescriptions: FOLIC ACID 1 MG TABS (FOLIC ACID) take 1 tab daily  #30 x 3   Entered by:   Larita Fife Via LPN   Authorized by:   Joni Reining, NP  Signed by:   Larita Fife Via LPN on 13/06/6577   Method used:   Electronically to        [facility]  [address] (retail)       [address]       [place], [place]  [address]       Ph: [phone]       Fax: [phone]   RxID:   [id number]  We need a weight on him to ascertain if he is gaining wt.  Have him do a CXR, BNP, CBC, BMET today. We can refill his folic acid. Make sure follow-up planned with Dr. Diona Browner when he is here next week.  Joni Reining NP  Pt. states he checks his weight a bedtime and that he cant see that well, he's not sure but thinks last nights weight was 134. Pt. advised to check weight in the morning and let us know what it is when he comes by to pick up Lab and CXR order.        Larita Fife Via LPN  October 12, 2010 1:51 PM
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
## 2010-12-05 NOTE — Assessment & Plan Note (Signed)
Summary: 3-4 wk f/u per checkout on 08/21/10/tg   Visit Type:  Follow-up Primary Provider:  [facility] Department   History of Present Illness: 55 year old male presents for followup. He was recently seen by Ms. Lawrence on 1 November related to increased shortness of breath. Lasix was increased at that time following fairly recent reduction in dose. BNP level was noted to have increased up to 2628, BUN 16, creatinine 0.9.  Followup labs from 4 November show sodium 133, potassium 4.4, BUN 21, creatinine 1.0, BNP 3635.  He states he feels "much better." He is not having any further orthopnea or PND. He reports NYHA class II dyspnea on exertion, no lower extremity edema. Indicates stable weight, fluctuating by perhaps one pound upper down. States appetite is "good."  He continues to report abstinence from alcohol. Reports compliance with medications.  Clinical Review Panels:  Echocardiogram Echocardiogram   Study Conclusions    - Left ventricle: The cavity size was mildly to moderately dilated.     Wall thickness was normal. Systolic function was moderately to     severely reduced. The estimated ejection fraction was in the range     of 30% to 35%. Diffuse hypokinesis. Probable akinesis of the     anteroseptal myocardium. The study is not technically sufficient     to allow evaluation of LV diastolic function.   - Ventricular septum: The contour showed systolic flattening.   - Aortic valve: Mildly calcified annulus. Possibly bicuspid;     moderately calcified leaflets. Cusp separation was reduced.     Moderate regurgitation directed eccentrically in the LVOT. Mean     gradient: 12mm Hg (S). Peak gradient: 18mm Hg (S).   - Mitral valve: Mild regurgitation.   - Left atrium: The atrium was mildly dilated.   - Right ventricle: The cavity size was mildly dilated. Systolic     function was mildly reduced.   - Right atrium: The atrium was mildly dilated.   - Tricuspid valve:  Mild regurgitation.   - Inferior vena cava: The vessel was dilated; the respirophasic     diameter changes were blunted (< 50%); findings are consistent     with elevated central venous pressure.   - Pericardium, extracardiac: There was no pericardial effusion.   Transthoracic echocardiography. M-mode, complete 2D, spectral   Doppler, and color Doppler. Height: Height: 180.3cm. Height: 71in.   Weight: Weight: 63.5kg. Weight: 139.7lb. Body mass index: BMI:   19.5kg/m^2. Body surface area: BSA: 1.21m^2. Patient status:   Inpatient. Location: Bedside. (07/28/2010)  Cardiac Imaging Cardiac Cath Findings PROCEDURAL FINDINGS:  Right atrial pressure mean of 10, right   ventricular pressure 40/15, pulmonary artery pressure 43/25 with a mean   of 32, pulmonary capillary wedge pressure mean of 25, left ventricular   pressure is 109/24, aortic pressure is 85/53 with a mean of 67.      Oxygen saturations:  Aortic saturation is 87%, pulmonary artery 51%.      Cardiac outputs 3.4 liters per minute.  Cardiac index is 1.9 liters per   minute per meter square.      Aortic valve mean gradient was 18, peak-to-peak gradient was 24 and   aortic valve area calculated to 0.73.      Left ventriculography showed severe global left ventricular systolic   dysfunction with an LVEF of 20% and moderate mitral regurgitation.      On plain fluoroscopy, the aortic valve is very heavily calcified and the   valve appears to  have eccentric opening.      CORONARY ANGIOGRAPHY:  The left main is widely patent.  It divides into   the LAD, intermediate branch, and left circumflex.      LAD:  The LAD is patent throughout its course, it gives off two diagonal   branches.  There is no significant stenosis throughout the LAD or its   diagonal branches.      Ramus intermedius:  The ramus is patent throughout its course.  It is a   moderate-sized vessel.   Left circumflex:  The left circumflex covers a large distribution.   It   supplies a large first OM and two posterolateral branches.  There is no   significant stenosis throughout the left circumflex distribution.  The   circumflex is codominant with the right coronary artery.      Right coronary artery:  The right coronary artery is also smooth   throughout its course gives off a single PDA branch.  There is also an   acute marginal branch.  The vessel has no significant stenosis and is   essentially normal. (08/03/2010)    Current Medications (verified): 1)  Coreg 3.125 Mg Tabs (Carvedilol) .... Take 1 Tab Two Times A Day 2)  Folic Acid 1 Mg Tabs (Folic Acid) .... Take 1 Tab Daily 3)  Furosemide 80 Mg Tabs (Furosemide) .... Take 1 Tablet By Mouth Two Times A Day 4)  Lisinopril 2.5 Mg Tabs (Lisinopril) .... Take 1 Tab Daily 5)  Daily Multi  Tabs (Multiple Vitamins-Minerals) .... Take 1 Tab Daily 6)  Potassium Chloride 20 Meq/82ml Soln (Potassium Chloride) .... Take 1 Tab Daily 7)  Spironolactone 25 Mg Tabs (Spironolactone) .... Take 1/2 Tab Daily 8)  Thiamine Hcl 100 Mg Tabs (Thiamine Hcl) .... Take 1 Tab Daily 9)  Spiriva Handihaler 18 Mcg Caps (Tiotropium Bromide Monohydrate) .... Inhale 1 Cap Daily 10)  Aspirin 325 Mg Tabs (Aspirin) .... Take 1 Tab Daily 11)  Vitamin B-12 250 Mcg Tabs (Cyanocobalamin) .... 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  The patient denies anorexia, fever, weight gain, chest pain, syncope, peripheral edema, melena, and hematochezia.         No palpitations or syncope. Otherwise reviewed and negative.  Vital Signs:  Patient profile:   55 year old male Weight:      129 pounds BMI:     18.06 Pulse rate:   83 / minute BP sitting:   90 / 62  (right arm)  Vitals Entered By: Dreama Saa, CNA (September 14, 2010 1:46 PM)  Physical Exam  Additional Exam:  Thin, somewhat cachectic appearing male, no acute distress. HEENT: Conjunctiva and lids normal, oropharynx with moist mucosa, poor dentition. Neck: Supple, jugular venous pulsations visualized without frank JVP elevation, no bruits. Lungs: Clear to auscultation, nonlabored. Cardiac: Regular rate and rhythm with 2/6  systolic murmur at the base, 2-3/6 diastolic murmur at the base, no S3. Abdomen: Soft, nontender, bowel sounds present. Extremities: No pitting edema, some muscle wasting. Distal pulses one plus. Musculoskeletal: No kyphosis. Neuropsychiatric: Alert and oriented x3, affect appropriate.   Impression & Recommendations:  Problem # 1:  CHRONIC SYSTOLIC HEART FAILURE (ICD-428.22)  Symptomatically improved with less shortness of breath. Weight is down 2 pounds from last visit. Plan to continue current medication doses including the increase in Lasix to 80 mg p.o. b.i.d. Renal function and potassium are stable. Relatively low blood pressure limits further up titration of Coreg and ACE inhibitor. Will see him back over the next month with BMETand BNP.  His updated medication list for this problem includes:    Coreg 3.125 Mg Tabs (Carvedilol) .Marland Kitchen... Take 1 tab two times a day    Furosemide 80 Mg Tabs (Furosemide) .Marland Kitchen... Take 1 tablet by mouth two times a day    Lisinopril 2.5 Mg Tabs (Lisinopril) .Marland Kitchen... Take 1 tab daily    Spironolactone 25 Mg Tabs (Spironolactone) .Marland Kitchen... Take 1/2 tab daily    Aspirin 325  Mg Tabs (Aspirin) .Marland Kitchen... Take 1 tab daily  Problem # 2:  ALCOHOL ABUSE (ICD-305.00)  Patient reports continued abstinence from alcohol.  Problem # 3:  CARDIOMYOPATHY, SECONDARY (ICD-425.9)  Nonischemic cardiomyopathy, no significant CAD at catheterization.  His updated medication list for this problem includes:    Coreg 3.125 Mg Tabs (Carvedilol) .Marland Kitchen... Take 1 tab two times a day    Furosemide 80 Mg Tabs (Furosemide) .Marland Kitchen... Take 1 tablet by mouth two times a day    Lisinopril 2.5 Mg Tabs (Lisinopril) .Marland Kitchen... Take 1 tab daily    Spironolactone 25 Mg Tabs (Spironolactone) .Marland Kitchen... Take 1/2 tab daily    Aspirin 325 Mg Tabs (Aspirin) .Marland Kitchen... Take 1 tab daily  Problem # 4:  AORTIC VALVE DISORDERS (ICD-424.1)  Moderate aortic stenosis with bicuspid valve, as well as moderate aortic regurgitation.  His updated medication list for this problem includes:    Coreg 3.125 Mg Tabs (Carvedilol) .Marland Kitchen... Take 1 tab two times a day    Furosemide 80 Mg Tabs (Furosemide) .Marland Kitchen... Take 1 tablet by mouth two times a day    Lisinopril 2.5 Mg Tabs (Lisinopril) .Marland Kitchen... Take 1 tab daily    Spironolactone 25 Mg Tabs (Spironolactone) .Marland Kitchen... Take 1/2 tab daily  Other Orders: Future Orders: T-Lipid Profile ([id number]) ... 10/09/2010 T-Hepatic Function [id number]) ... 10/09/2010  Patient Instructions: 1)  Your physician recommends that you schedule a follow-up appointment in: 1 month 2)  Your physician recommends that you return for lab work in: 1 month, just before next office visit. 3)  Your physician recommends that you continue on your current medications as directed. Please refer to the Current Medication list given to you today.

## 2010-12-05 NOTE — Discharge Summary (Signed)
Benjamin [name]                  ACCOUNT NO.:  [id number]  MEDICAL RECORD NO.:  [id number]          PATIENT TYPE:  INP  LOCATION:  2909                         FACILITY:  [facility]  PHYSICIAN:  Veverly Fells. Excell Seltzer, MD  DATE OF BIRTH:  11/26/1955  DATE OF ADMISSION:  11/15/2010 DATE OF DISCHARGE:  11/18/2010                              DISCHARGE SUMMARY   TRANSFER SUMMARY  EXPECTED DATE OF TRANSFER:  November 18, 2010.  FINAL DIAGNOSIS:  Cardiogenic shock.  SECONDARY DIAGNOSES: 1. Acute on chronic systolic heart failure. 2. Severe bicuspid aortic valve stenosis. 3. Transaminitis, improving. 4. Acute renal insufficiency, improving. 5. History of heavy alcohol use, quit since September 2011. 6. History of tobacco, quit since April 2011.  HOSPITAL COURSE:  Benjamin [name] is a 55 year old gentleman, well known to our practice from previous admission in September 2011, when he presented with a systolic heart failure.  The patient was found to have biventricular heart failure with dilated left ventricle and ejection fraction in the range of 30%.  He also was diagnosed with bicuspid aortic valve at that time.  He ultimately underwent cardiac catheterization and transesophageal echocardiography during his index admission in September.  His catheterization demonstrated no significant coronary artery disease, severe global left ventricular dysfunction with an estimated ejection fraction of 20%, and a heavily calcified aortic valve with severe aortic stenosis.  He essentially had low gradient aortic stenosis with a mean aortic valve gradient of 18 and a peak-to- peak gradient of 24.  His aortic valve area calculated to 0.7 in the setting of a low cardiac output.  The patient has been compliant with medical therapy since that time and he has been followed by Dr. Diona Browner in our [place] office.  He has tolerated medical therapy relatively well until the last few weeks when he has started  having abdominal pain. He has also noted dyspnea with exertion.  He was readmitted on January 11 with evidence of low output congestive heart failure.  His transaminases liver function tests were elevated, creatinine was elevated, and he had physical exam evidence of right-sided volume overload with markedly elevated JVP.  The patient was hypotensive and required inotropic support.  He was initially treated with milrinone and dopamine.  He demonstrated clinical improvement over 48 hours and was also started on a furosemide drip.  He began to have very good urine output and his abdominal pain diminished.  His creatinine returned to normal and his transaminases also improved.  He did have a long run of ventricular tachycardia on a combination of dopamine and milrinone and his dopamine was weaned off.  His vasopressor was changed to norepinephrine in the setting of aortic stenosis and milrinone was ultimately weaned off as well.  The patient underwent a repeat echocardiogram in the setting of inotropic therapy and interestingly his mean aortic valve gradient increased to 40 mmHg.  His previous echo from September showed a mean gradient of only 12 mmHg.  In summary, we feel that the patient has a combination of a nonischemic dilated cardiomyopathy, likely secondary to longstanding heavy alcohol use as well as severe aortic  stenosis with a marked change in aortic valve gradient on inotropic therapy substantiating significant aortic stenosis.  Labs dated January 13 demonstrated a creatinine of 0.97, potassium of 3.1, sodium of 135.  His CBC showed white blood cell count of 9700, hemoglobin 13, hematocrit 37.5, and platelets 159,000.  His transaminases showed a AST of 181 and an ALT of 295.  These were improved from previous.  Bilirubin was elevated at 1.9.  His alkaline phosphatase was normal at 51.  Chest x-ray from January 13 showed appropriate position of a right PICC line and increased  right effusion with basilar air space disease.  His medications at the time of transfer include aspirin, ceftriaxone, which will be discontinued, and Levophed.  The patient is also using as- needed Zofran for nausea.  DISPOSITION:  Dr. Gala Romney discussed the patient's case with Dr. Demetrios Loll at [facility].  The patient will be transferred to [facility] for consideration of aortic valve surgery as there is a high likelihood he may need hemodynamic support considering his severe cardiomyopathy and low output CHF.  This was all discussed in depth with the patient who understands the rationale for transfer to a tertiary center.     Veverly Fells. Excell Seltzer, MD     MDC/MEDQ  D:  11/17/2010  T:  11/18/2010  Job:  [id number]  Electronically Signed by Tonny Bollman MD on 12/05/2010 04:45:48 AM

## 2010-12-05 NOTE — Progress Notes (Signed)
Summary: PT GAINED WEIGHT  Phone Note Call from Patient Call back at Home Phone [phone]   Reason for Call: Talk to Nurse Summary of Call: PT THINKS HE HAS BEEN GAINING WEIGHT CAN NOT TELL BECAUSE HIS EYE SIGHT IS BAD AND CANNOT READ THE SCALES. WANTS TO KNOW IF WE CAN CALL HIM IN ANYTHING ELSE TO TAKE THE FLUID OFF. I OFFERED A APPOINTMENT WITH KATHRYN ON THURSDAY HE DECLINED. Initial call taken by: Faythe Ghee,  October 02, 2010 9:55 AM  Follow-up for Phone Call        S: Pt. states he has gained 4 pounds in 1 week. B: On last OV with Dr. Diona Browner on 11-10 pt. was advised to continue lasix dose of 80mg  by mouth two times a day. Pt. also takes Spironolactone 12.5mg  once daily. A: Pt. states he is SOB and has swelling in feet and ankles. He states he has been taking Lasix as directed but has also been eating some fast foods (Pizza and chili). R: Pt. advised to elevate legs as often as possible, avoid salty foods and that we will contact him with Dr. Ival Bible recommendations.  Follow-up by: Larita Fife Via LPN,  October 02, 2010 10:58 AM  Additional Follow-up for Phone Call Additional follow up Details #1::        Could be related to diet. No more than 2 g sodium a day or 2 L of fluid a day. In addition to these modifications, suggest taking 1-1/2 80 mg Lasix pills in the morning with one pill in the evening, for the next 2 days, then resume prior dose at 80 mg p.o. b.i.d. Additional Follow-up by: Loreli Slot, MD, [facility],  October 02, 2010 12:28 PM    Additional Follow-up for Phone Call Additional follow up Details #2::    Pt. advised, he states he understands instructions given. Low sodium diets mailed to pt. and he states he will call us Thurday if SOB and swelling has not improved. Follow-up by: Larita Fife Via LPN,  October 02, 2010 1:11 PM
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
## 2010-12-05 NOTE — Letter (Signed)
Summary: Handout Printed  Printed Handout:  - Diet - Seasoning without Salt 

## 2010-12-05 NOTE — Letter (Signed)
Summary: Handout Printed  Printed Handout:  - Diet - Sodium-Controlled 

## 2010-12-07 NOTE — Assessment & Plan Note (Signed)
Summary: 1 MTH F/UPER CHECKOUT ON 09/14/10/TG   Visit Type:  Follow-up Primary Provider:  [facility] Department   History of Present Illness: 54 year old male presents for followup. I saw him back in November. At that time he was clinically stable following increase in diuretic dosing. Subsequent phone note reviewed with patient describing weight gain of 4 pounds over a week. Diet had not been particularly good. Sodium restriction was discussed and Lasix was temporarily advanced.  He reports that weight came down, has been stable. He has patent more attention to fluid restriction and also sodium restriction. Reports compliance with his medications.  Followup lab work from this month shows cholesterol 162, triglycerides 107, HDL 41, LDL 100, AST 19, ALT 16, hemoglobin 14.4, platelets 194, sodium 136, potassium 4.3, BUN 23, creatinine 1.0, BNP 3660. Chest x-ray reviewed below.  Current Medications (verified): 1)  Coreg 3.125 Mg Tabs (Carvedilol) .... Take 1 Tab Two Times A Day 2)  Folic Acid 1 Mg Tabs (Folic Acid) .... Take 1 Tab Daily 3)  Furosemide 80 Mg Tabs (Furosemide) .... Take 1 Tablet By Mouth Two Times A Day 4)  Lisinopril 2.5 Mg Tabs (Lisinopril) .... Take 1 Tab Daily 5)  Daily Multi  Tabs (Multiple Vitamins-Minerals) .... Take 1 Tab Daily 6)  Potassium Chloride 20 Meq/74ml Soln (Potassium Chloride) .... Take 1 Tab Daily 7)  Spironolactone 25 Mg Tabs (Spironolactone) .... Take 1 Tablet By Mouth Once A Day 8)  Thiamine Hcl 100 Mg Tabs (Thiamine Hcl) .... Take 1 Tab Daily 9)  Spiriva Handihaler 18 Mcg Caps (Tiotropium Bromide Monohydrate) .... Inhale 1 Cap Daily 10)  Aspirin 325 Mg Tabs (Aspirin) .... Take 1 Tab Daily 11)  Vitamin B-12 250 Mcg Tabs (Cyanocobalamin) .... 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  The patient denies anorexia, fever, weight gain, chest pain, syncope, peripheral edema, melena, and hematochezia.         Appetite stable. Otherwise reviewed and negative.  Vital Signs:  Patient profile:   55 year old male Weight:      134 pounds O2 Sat:      95 % on Room air Pulse rate:   86 / minute BP sitting:   87 / 62  (left arm)  Vitals Entered By: Dreama Saa, CNA (October 20, 2010 1:32 PM)  O2 Flow:  Room air  Physical Exam  Additional Exam:  Thin, somewhat cachectic appearing male, no acute distress. HEENT: Conjunctiva and lids normal, oropharynx with moist mucosa, poor dentition. Neck: Supple, jugular venous pulsations visualized without frank JVP elevation, no bruits. Lungs: Clear to auscultation, nonlabored. Cardiac: Regular rate and rhythm with 2/6 systolic murmur at the base, 2-3/6 diastolic murmur at the base, no S3. Abdomen: Soft, nontender, bowel sounds present. Extremities: No pitting edema, some muscle wasting. Distal pulses one plus. Musculoskeletal: No kyphosis. Neuropsychiatric: Alert and oriented x3, affect appropriate.   CXR  Procedure date:  10/13/2010  Findings:      Comparison: Chest x-ray of 07/28/2010   Findings: Moderate cardiomegaly is stable.  Only minimal pulmonary vascular congestion is seen.  There is a right pleural effusion present with right basilar atelectasis.  No bony abnormality is seen.   IMPRESSION: Moderate cardiomegaly with mild pulmonary vascular  congestion. Small right pleural effusion.  Impression & Recommendations:  Problem # 1:  CHRONIC SYSTOLIC HEART FAILURE (ICD-428.22)  Symptomatically stable at this time. Continue to work on fluid restriction and sodium restriction. Advance Aldactone to 25 mg daily, otherwise keep remaining medicines stable. Blood pressure limits further titration to large degree. I will see him back over the next month with a BMET.  His updated medication list for this problem includes:    Coreg 3.125 Mg Tabs (Carvedilol) .Marland Kitchen... Take 1 tab two times a day    Furosemide 80 Mg Tabs (Furosemide) .Marland Kitchen... Take 1 tablet by mouth two times a day    Lisinopril 2.5 Mg Tabs (Lisinopril) .Marland Kitchen... Take 1 tab daily    Spironolactone 25 Mg Tabs (Spironolactone) .Marland Kitchen... Take 1 tablet by mouth once a day    Aspirin 325 Mg Tabs (Aspirin) .Marland Kitchen... Take 1 tab daily  Problem # 2:  AORTIC VALVE DISORDERS (ICD-424.1)  Moderate aortic stenosis with bicuspid valve, as well as moderate aortic regurgitation. Will consider a followup echocardiogram around the time of his next visit.  His updated medication list for this problem includes:    Coreg 3.125 Mg Tabs (Carvedilol) .Marland Kitchen... Take 1 tab two times a day    Furosemide 80 Mg Tabs (Furosemide) .Marland Kitchen... Take 1 tablet by mouth two times a day    Lisinopril 2.5 Mg Tabs (Lisinopril) .Marland Kitchen... Take 1 tab daily    Spironolactone 25 Mg Tabs (Spironolactone) .Marland Kitchen... Take 1/2 tab daily  Other Orders: Future Orders: T-Basic Metabolic Panel [id number]) ... 11/10/2010  Clinical Review Panels:  Echocardiogram Echocardiogram   Study Conclusions    - Left ventricle: The cavity size was mildly to moderately dilated.     Wall thickness was normal. Systolic function was moderately to     severely reduced. The estimated ejection fraction was in the range     of 30% to 35%. Diffuse hypokinesis. Probable akinesis of the     anteroseptal myocardium. The study is not technically sufficient     to allow  evaluation of LV diastolic function.   - Ventricular septum: The contour showed systolic flattening.   - Aortic valve: Mildly calcified annulus. Possibly bicuspid;     moderately calcified leaflets. Cusp separation was reduced.     Moderate regurgitation directed eccentrically in the LVOT. Mean     gradient: 12mm Hg (S). Peak gradient: 18mm Hg (S).   - Mitral valve: Mild regurgitation.   - Left atrium: The atrium was mildly dilated.   - Right ventricle: The cavity size was mildly dilated. Systolic     function was mildly reduced.   - Right atrium: The atrium was mildly dilated.   - Tricuspid valve: Mild regurgitation.   - Inferior vena cava: The vessel was dilated; the respirophasic     diameter changes were blunted (< 50%); findings are consistent     with elevated central venous pressure.   - Pericardium, extracardiac: There was no pericardial effusion.   Transthoracic echocardiography. M-mode, complete 2D, spectral   Doppler, and color Doppler. Height: Height: 180.3cm. Height: 71in.   Weight: Weight: 63.5kg. Weight: 139.7lb. Body mass index: BMI:  19.5kg/m^2. Body surface area: BSA: 1.52m^2. Patient status:   Inpatient. Location: Bedside. (07/28/2010)    Patient Instructions: 1)  Your physician recommends that you schedule a follow-up appointment in: 1 month 2)  Your physician recommends that you return for lab work in:3 weeks 3)  Your physician has recommended you make the following change in your medication: increase spironalactone to 1 tablet daily Prescriptions: SPIRONOLACTONE 25 MG TABS (SPIRONOLACTONE) Take 1 tablet by mouth once a day  #30 x 3   Entered by:   Teressa Lower RN   Authorized by:   Loreli Slot, MD, [facility]   Signed by:   Teressa Lower RN on 10/20/2010   Method used:   Electronically to        [facility]  [address] (retail)       [address]       [place], [place]  [address]       Ph: [phone]       Fax: [phone]   RxID:    [id number]

## 2010-12-07 NOTE — Letter (Signed)
Summary: [place] [facility] Work Engineer, agricultural at [facility] [employer]  [address] [place] [address]   Phone: [phone]  Fax: [phone]     October 20, 2010 MRN: [id number]   [facility] [address], [place] , [place]  [address]      YOUR LAB WORK IS DUE   November 10, 2010  Please go to [facility], located across the street from [facility] on the second floor.  Hours are Monday - Friday 7am until 7:30pm         Saturday 8am until 12noon    __  DO NOT EAT OR DRINK AFTER MIDNIGHT EVENING PRIOR TO LABWORK  _X_ YOUR LABWORK IS NOT FASTING --YOU MAY EAT PRIOR TO LABWORK

## 2010-12-07 NOTE — Letter (Signed)
Summary: Appointment - Missed  [facility]  [address], [place] [address]   Phone: [phone]  Fax: [phone]     November 22, 2010 MRN: [id number]   [name], [place], [place]  [address]   Dear Benjamin [name],  Our records indicate you missed your appointment on      11/22/10                  with Dr.       .            MCDOWELL                        It is very important that we reach you to reschedule this appointment. We look forward to participating in your health care needs. Please contact us at the number listed above at your earliest convenience to reschedule this appointment.     Sincerely,    Glass blower/designer

## 2010-12-07 NOTE — Letter (Signed)
Summary: [place] Results Engineer, agricultural at [facility]  [address], [place] [address]   Phone: [phone]  Fax: [phone]      November 13, 2010 MRN: [id number]   [address], [place], [place]  [address]   Dear Mr. FITZWATER,  Your test ordered by Selena Batten has been reviewed by your physician (or physician assistant) and was found to be normal or stable. Your physician (or physician assistant) felt no changes were needed at this time.  ____ Echocardiogram  ____ Cardiac Stress Test  __X__ Lab Work  ____ Peripheral vascular study of arms, legs or neck  ____ CT scan or X-ray  ____ Lung or Breathing test  ____ Other:  Please continue on current medical treatment.   Thank you.   Nona Dell, MD, F.A.C.C

## 2010-12-13 ENCOUNTER — Inpatient Hospital Stay
Admission: RE | Admit: 2010-12-13 | Discharge: 2010-12-27 | Disposition: A | Discharge status: Home / Self Care | Payer: Medicaid Other | Source: Ambulatory Visit | Attending: Internal Medicine | Admitting: Internal Medicine

## 2010-12-13 NOTE — Consult Note (Signed)
Summary: [facility]   [facility]   Imported By: Roderic Ovens 12/07/2010 15:09:41  _____________________________________________________________________  External Attachment:    Type:   Image     Comment:   External Document

## 2010-12-25 ENCOUNTER — Telehealth (INDEPENDENT_AMBULATORY_CARE_PROVIDER_SITE_OTHER): Payer: Self-pay | Admitting: *Deleted

## 2011-01-02 NOTE — Progress Notes (Signed)
Summary: Called pt  Phone Note Outgoing Call Call back at [facility] Phone [phone]   Caller: Patient Call placed by: Harlon Flor,  December 25, 2010 3:51 PM Call placed to: Patient Summary of Call: Attempted to call pt to reschedule 3/7 appt with Bensimhon to tomorrow. Initial call taken by: Harlon Flor,  December 25, 2010 3:52 PM

## 2011-01-04 ENCOUNTER — Encounter: Payer: Self-pay | Admitting: Cardiology

## 2011-01-04 ENCOUNTER — Ambulatory Visit (INDEPENDENT_AMBULATORY_CARE_PROVIDER_SITE_OTHER): Payer: Medicaid Other | Admitting: Cardiology

## 2011-01-04 ENCOUNTER — Ambulatory Visit (HOSPITAL_COMMUNITY)
Admission: RE | Admit: 2011-01-04 | Discharge: 2011-01-04 | Disposition: A | Discharge status: Home / Self Care | Payer: Medicaid Other | Source: Ambulatory Visit | Attending: Cardiology | Admitting: Cardiology

## 2011-01-04 ENCOUNTER — Other Ambulatory Visit (HOSPITAL_COMMUNITY): Payer: Self-pay | Admitting: Cardiology

## 2011-01-04 ENCOUNTER — Other Ambulatory Visit: Payer: Self-pay | Admitting: Cardiology

## 2011-01-04 DIAGNOSIS — I5022 Chronic systolic (congestive) heart failure: Secondary | ICD-10-CM

## 2011-01-04 DIAGNOSIS — I359 Nonrheumatic aortic valve disorder, unspecified: Secondary | ICD-10-CM

## 2011-01-04 DIAGNOSIS — Z952 Presence of prosthetic heart valve: Secondary | ICD-10-CM

## 2011-01-04 DIAGNOSIS — Z953 Presence of xenogenic heart valve: Secondary | ICD-10-CM | POA: Insufficient documentation

## 2011-01-04 DIAGNOSIS — I429 Cardiomyopathy, unspecified: Secondary | ICD-10-CM

## 2011-01-04 DIAGNOSIS — Z954 Presence of other heart-valve replacement: Secondary | ICD-10-CM | POA: Insufficient documentation

## 2011-01-04 DIAGNOSIS — Z09 Encounter for follow-up examination after completed treatment for conditions other than malignant neoplasm: Secondary | ICD-10-CM | POA: Insufficient documentation

## 2011-01-04 LAB — CONVERTED CEMR LAB
BUN: 8 mg/dL (ref 6–23)
Calcium: 9.3 mg/dL (ref 8.4–10.5)
Creatinine, Ser: 0.54 mg/dL (ref 0.40–1.50)
Glucose, Bld: 69 mg/dL — ABNORMAL LOW (ref 70–99)
Potassium: 4.5 meq/L (ref 3.5–5.3)

## 2011-01-05 ENCOUNTER — Encounter: Payer: Self-pay | Admitting: Cardiology

## 2011-01-08 ENCOUNTER — Encounter (INDEPENDENT_AMBULATORY_CARE_PROVIDER_SITE_OTHER): Payer: Self-pay | Admitting: *Deleted

## 2011-01-09 ENCOUNTER — Ambulatory Visit (HOSPITAL_COMMUNITY)
Admission: RE | Admit: 2011-01-09 | Discharge: 2011-01-09 | Disposition: A | Discharge status: Home / Self Care | Payer: Medicaid Other | Source: Ambulatory Visit | Attending: Cardiology | Admitting: Cardiology

## 2011-01-09 DIAGNOSIS — I517 Cardiomegaly: Secondary | ICD-10-CM

## 2011-01-09 DIAGNOSIS — Z954 Presence of other heart-valve replacement: Secondary | ICD-10-CM | POA: Insufficient documentation

## 2011-01-09 DIAGNOSIS — I428 Other cardiomyopathies: Secondary | ICD-10-CM | POA: Insufficient documentation

## 2011-01-10 ENCOUNTER — Encounter: Payer: Self-pay | Admitting: Cardiology

## 2011-01-10 ENCOUNTER — Ambulatory Visit: Payer: Self-pay | Admitting: Internal Medicine

## 2011-01-11 ENCOUNTER — Encounter: Payer: Self-pay | Admitting: *Deleted

## 2011-01-11 ENCOUNTER — Encounter (INDEPENDENT_AMBULATORY_CARE_PROVIDER_SITE_OTHER): Payer: Self-pay | Admitting: *Deleted
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
## 2011-01-16 NOTE — Letter (Signed)
Summary: Appointment - Reschedule  [facility], Main Office  [address]   [place], [place] [address]   Phone: [phone]  Fax: [phone]     January 11, 2011 MRN: [id number]   [name] [place], [place]  [address]   Dear [name],   Due to a change in our office schedule, your appointment on  March 29,2012 at 2:45 must be changed.  It is very important that we reach you to reschedule this appointment. We look forward to participating in your health care needs. Please contact us at the number listed above at your earliest convenience to reschedule this appointment.     Sincerely, Control and instrumentation engineer

## 2011-01-16 NOTE — Letter (Signed)
Summary: [place] Results Engineer, agricultural at [facility]  [address] [place] [address]   Phone: [phone]  Fax: [phone]      January 10, 2011 MRN: [id number]   [name] [place], [place]  [address]   Dear Mr. [name],  Your test ordered by Selena Batten has been reviewed by your physician (or physician assistant) and was found to be normal or stable. Your physician (or physician assistant) felt no changes were needed at this time.  __X__ Echocardiogram  ____ Cardiac Stress Test  ____ Lab Work  ____ Peripheral vascular study of arms, legs or neck  ____ CT scan or X-ray  ____ Lung or Breathing test  ____ Other:  Please continue on current medical treatment.  Thank you.   Nona Dell, MD, F.A.C.C

## 2011-01-16 NOTE — Letter (Signed)
Summary: [place] Results Engineer, agricultural at [facility]  [address], [place] [address]   Phone: [phone]  Fax: [phone]      January 08, 2011 MRN: [id number]   [place] F[address] [place], [place]  [address]   Dear Mr. KEM,  Your test ordered by Selena Batten has been reviewed by your physician (or physician assistant) and was found to be normal or stable. Your physician (or physician assistant) felt no changes were needed at this time.  ____ Echocardiogram  ____ Cardiac Stress Test  __x__ Lab Work  ____ Peripheral vascular study of arms, legs or neck  __x__ CT scan or X-ray  ____ Lung or Breathing test  ____ Other:  No change in current medical treatment at this time per Dr. Diona Browner. Thank you,  Javiel Canepa Allyne [name] RN    Foster Bing, MD, Lenise Arena.C.Gaylord Shih, MD, F.A.C.C Lewayne Bunting, MD, F.A.C.C Nona Dell, MD, F.A.C.C Charlton Haws, MD, [facility] Arena.C.C

## 2011-01-18 LAB — BASIC METABOLIC PANEL
BUN: 18 mg/dL (ref 6–23)
BUN: 19 mg/dL (ref 6–23)
BUN: 13 mg/dL (ref 6–23)
BUN: 14 mg/dL (ref 6–23)
BUN: 12 mg/dL (ref 6–23)
BUN: 13 mg/dL (ref 6–23)
CO2: 32 mEq/L (ref 19–32)
CO2: 23 mEq/L (ref 19–32)
CO2: 26 mEq/L (ref 19–32)
CO2: 27 mEq/L (ref 19–32)
CO2: 27 mEq/L (ref 19–32)
CO2: 26 mEq/L (ref 19–32)
CO2: 29 mEq/L (ref 19–32)
Calcium: 8.9 mg/dL (ref 8.4–10.5)
Calcium: 9.1 mg/dL (ref 8.4–10.5)
Calcium: 9.2 mg/dL (ref 8.4–10.5)
Calcium: 9.4 mg/dL (ref 8.4–10.5)
Calcium: 9.5 mg/dL (ref 8.4–10.5)
Calcium: 8.4 mg/dL (ref 8.4–10.5)
Calcium: 8.8 mg/dL (ref 8.4–10.5)
Calcium: 8.9 mg/dL (ref 8.4–10.5)
Calcium: 9.1 mg/dL (ref 8.4–10.5)
Calcium: 9.1 mg/dL (ref 8.4–10.5)
Chloride: 102 mEq/L (ref 96–112)
Chloride: 103 mEq/L (ref 96–112)
Chloride: 100 mEq/L (ref 96–112)
Chloride: 102 mEq/L (ref 96–112)
Creatinine, Ser: 0.86 mg/dL (ref 0.4–1.5)
Creatinine, Ser: 0.96 mg/dL (ref 0.4–1.5)
Creatinine, Ser: 1.01 mg/dL (ref 0.4–1.5)
Creatinine, Ser: 0.87 mg/dL (ref 0.4–1.5)
Creatinine, Ser: 0.88 mg/dL (ref 0.4–1.5)
Creatinine, Ser: 0.89 mg/dL (ref 0.4–1.5)
Creatinine, Ser: 1.11 mg/dL (ref 0.4–1.5)
Creatinine, Ser: 1.12 mg/dL (ref 0.4–1.5)
GFR calc Af Amer: >60 mL/min (ref >60)
GFR calc Af Amer: >60 mL/min (ref >60)
GFR calc Af Amer: >60 mL/min (ref >60)
GFR calc Af Amer: >60 mL/min (ref >60)
GFR calc Af Amer: >60 mL/min (ref >60)
GFR calc Af Amer: >60 mL/min (ref >60)
GFR calc Af Amer: >60 mL/min (ref >60)
GFR calc Af Amer: >60 mL/min (ref >60)
GFR calc non Af Amer: >60 mL/min (ref >60)
GFR calc non Af Amer: >60 mL/min (ref >60)
GFR calc non Af Amer: >60 mL/min (ref >60)
GFR calc non Af Amer: >60 mL/min (ref >60)
GFR calc non Af Amer: >60 mL/min (ref >60)
GFR calc non Af Amer: >60 mL/min (ref >60)
GFR calc non Af Amer: >60 mL/min (ref >60)
GFR calc non Af Amer: >60 mL/min (ref >60)
GFR calc non Af Amer: >60 mL/min (ref >60)
Glucose, Bld: 89 mg/dL (ref 70–99)
Glucose, Bld: 90 mg/dL (ref 70–99)
Glucose, Bld: 92 mg/dL (ref 70–99)
Glucose, Bld: 103 mg/dL — ABNORMAL HIGH (ref 70–99)
Glucose, Bld: 82 mg/dL (ref 70–99)
Glucose, Bld: 89 mg/dL (ref 70–99)
Glucose, Bld: 96 mg/dL (ref 70–99)
Potassium: 3.5 mEq/L (ref 3.5–5.1)
Potassium: 3.8 mEq/L (ref 3.5–5.1)
Potassium: 4 mEq/L (ref 3.5–5.1)
Potassium: 4.1 mEq/L (ref 3.5–5.1)
Potassium: 3.9 mEq/L (ref 3.5–5.1)
Potassium: 4.4 mEq/L (ref 3.5–5.1)
Sodium: 134 mEq/L — ABNORMAL LOW (ref 135–145)
Sodium: 134 mEq/L — ABNORMAL LOW (ref 135–145)
Sodium: 135 mEq/L (ref 135–145)
Sodium: 137 mEq/L (ref 135–145)
Sodium: 136 mEq/L (ref 135–145)
Sodium: 137 mEq/L (ref 135–145)
Sodium: 137 mEq/L (ref 135–145)
Sodium: 135 mEq/L (ref 135–145)

## 2011-01-18 LAB — CARDIAC PANEL(CRET KIN+CKTOT+MB+TROPI)
Relative Index: INVALID (ref 0.0–2.5)
Relative Index: INVALID (ref 0.0–2.5)
Relative Index: INVALID (ref 0.0–2.5)
Total CK: 29 U/L (ref 7–232)
Total CK: 34 U/L (ref 7–232)
Troponin I: 0.07 ng/mL — ABNORMAL HIGH (ref 0.00–0.06)
Troponin I: 0.07 ng/mL — ABNORMAL HIGH (ref 0.00–0.06)

## 2011-01-18 LAB — MAGNESIUM: Magnesium: 1.6 mg/dL (ref 1.5–2.5)

## 2011-01-18 LAB — COMPREHENSIVE METABOLIC PANEL
ALT: 15 U/L (ref 0–53)
AST: 24 U/L (ref 0–37)
Albumin: 3.5 g/dL (ref 3.5–5.2)
Albumin: 3.7 g/dL (ref 3.5–5.2)
Alkaline Phosphatase: 48 U/L (ref 39–117)
BUN: 12 mg/dL (ref 6–23)
Calcium: 8.9 mg/dL (ref 8.4–10.5)
Creatinine, Ser: 0.92 mg/dL (ref 0.4–1.5)
GFR calc Af Amer: >60 mL/min (ref >60)
Potassium: 4.5 mEq/L (ref 3.5–5.1)
Sodium: 140 mEq/L (ref 135–145)
Total Protein: 5.7 g/dL — ABNORMAL LOW (ref 6.0–8.3)
Total Protein: 5.9 g/dL — ABNORMAL LOW (ref 6.0–8.3)

## 2011-01-18 LAB — POCT I-STAT 3, VENOUS BLOOD GAS (G3P V)
O2 Saturation: 51 %
TCO2: 28 mmol/L (ref 0–100)
pCO2, Ven: 43.7 mmHg — ABNORMAL LOW (ref 45.0–50.0)
pH, Ven: 7.394 — ABNORMAL HIGH (ref 7.250–7.300)
pO2, Ven: 28 mmHg — CL (ref 30.0–45.0)

## 2011-01-18 LAB — POCT CARDIAC MARKERS
CKMB, poc: <1 ng/mL — ABNORMAL LOW (ref 1.0–8.0)
Myoglobin, poc: 25.3 ng/mL (ref 12–200)
Myoglobin, poc: 27.3 ng/mL (ref 12–200)
Troponin i, poc: <0.05 ng/mL (ref 0.00–0.09)

## 2011-01-18 LAB — LIPID PANEL: VLDL: 15 mg/dL (ref 0–40)

## 2011-01-18 LAB — DIFFERENTIAL
Basophils Absolute: 0.1 10*3/uL (ref 0.0–0.1)
Basophils Absolute: 0.1 10*3/uL (ref 0.0–0.1)
Basophils Relative: 1 % (ref 0–1)
Eosinophils Absolute: 0.3 10*3/uL (ref 0.0–0.7)
Eosinophils Relative: 2 % (ref 0–5)
Lymphocytes Relative: 27 % (ref 12–46)
Lymphs Abs: 1.6 10*3/uL (ref 0.7–4.0)
Lymphs Abs: 1.8 10*3/uL (ref 0.7–4.0)
Lymphs Abs: 2 10*3/uL (ref 0.7–4.0)
Monocytes Absolute: 0.6 10*3/uL (ref 0.1–1.0)
Monocytes Absolute: 0.7 10*3/uL (ref 0.1–1.0)
Monocytes Absolute: 0.8 10*3/uL (ref 0.1–1.0)
Neutro Abs: 4.3 10*3/uL (ref 1.7–7.7)
Neutro Abs: 4.9 10*3/uL (ref 1.7–7.7)
Neutrophils Relative %: 67 % (ref 43–77)

## 2011-01-18 LAB — CBC
HCT: 44.4 % (ref 39.0–52.0)
Hemoglobin: 14.4 g/dL (ref 13.0–17.0)
MCH: 32.2 pg (ref 26.0–34.0)
MCH: 32.5 pg (ref 26.0–34.0)
MCH: 31 pg (ref 26.0–34.0)
MCHC: 33.5 g/dL (ref 30.0–36.0)
MCHC: 33.6 g/dL (ref 30.0–36.0)
MCV: 95.6 fL (ref 78.0–100.0)
MCV: 93.7 fL (ref 78.0–100.0)
Platelets: 120 10*3/uL — ABNORMAL LOW (ref 150–400)
Platelets: 123 10*3/uL — ABNORMAL LOW (ref 150–400)
Platelets: 124 10*3/uL — ABNORMAL LOW (ref 150–400)
Platelets: 160 10*3/uL (ref 150–400)
RBC: 4.57 MIL/uL (ref 4.22–5.81)
RBC: 4.74 MIL/uL (ref 4.22–5.81)
RDW: 13.7 % (ref 11.5–15.5)
RDW: 13.9 % (ref 11.5–15.5)
RDW: 13.6 % (ref 11.5–15.5)
WBC: 7.2 10*3/uL (ref 4.0–10.5)
WBC: 7.3 10*3/uL (ref 4.0–10.5)
WBC: 7.1 10*3/uL (ref 4.0–10.5)

## 2011-01-18 LAB — POCT I-STAT 3, ART BLOOD GAS (G3+)
pCO2 arterial: 43.6 mmHg (ref 35.0–45.0)
pH, Arterial: 7.399 (ref 7.350–7.450)
pO2, Arterial: 54 mmHg — ABNORMAL LOW (ref 80.0–100.0)

## 2011-01-18 LAB — TSH
TSH: 2.342 u[IU]/mL (ref 0.350–4.500)
TSH: 2.471 u[IU]/mL (ref 0.350–4.500)

## 2011-01-18 LAB — PROTIME-INR
INR: 1.14 (ref 0.00–1.49)
Prothrombin Time: 14.8 seconds (ref 11.6–15.2)

## 2011-01-18 LAB — BRAIN NATRIURETIC PEPTIDE
Pro B Natriuretic peptide (BNP): >3200 pg/mL — ABNORMAL HIGH (ref 0.0–100.0)
Pro B Natriuretic peptide (BNP): 1920 pg/mL — ABNORMAL HIGH (ref 0.0–100.0)

## 2011-02-01 ENCOUNTER — Ambulatory Visit: Payer: Medicaid Other | Admitting: Internal Medicine

## 2011-02-05 ENCOUNTER — Encounter: Payer: Self-pay | Admitting: Cardiology

## 2011-02-05 ENCOUNTER — Ambulatory Visit: Payer: Medicaid Other | Admitting: Cardiology

## 2011-02-06 ENCOUNTER — Encounter: Payer: Self-pay | Admitting: Cardiology

## 2011-02-06 ENCOUNTER — Ambulatory Visit (INDEPENDENT_AMBULATORY_CARE_PROVIDER_SITE_OTHER): Payer: Medicaid Other | Admitting: Cardiology

## 2011-02-06 VITALS — BP 130/77 | HR 79 | Ht 71.0 in | Wt 128.0 lb

## 2011-02-06 DIAGNOSIS — I429 Cardiomyopathy, unspecified: Secondary | ICD-10-CM

## 2011-02-06 DIAGNOSIS — I5022 Chronic systolic (congestive) heart failure: Secondary | ICD-10-CM

## 2011-02-06 DIAGNOSIS — Z954 Presence of other heart-valve replacement: Secondary | ICD-10-CM

## 2011-02-06 DIAGNOSIS — I359 Nonrheumatic aortic valve disorder, unspecified: Secondary | ICD-10-CM

## 2011-02-06 MED ORDER — LISINOPRIL 2.5 MG PO TABS: 2.5000 mg | ORAL_TABLET | Freq: Every day | ORAL | Status: DC

## 2011-02-06 MED ORDER — AMPICILLIN 250 MG PO CAPS: 250.0000 mg | ORAL_CAPSULE | Freq: Four times a day (QID) | ORAL | Status: DC

## 2011-02-06 MED ORDER — SPIRONOLACTONE 25 MG PO TABS: 12.5000 mg | ORAL_TABLET | Freq: Every day | ORAL | Status: DC

## 2011-02-06 NOTE — Assessment & Plan Note (Signed)
Nonischemic, LVEF approximately 20%.

## 2011-02-06 NOTE — Assessment & Plan Note (Addendum)
Stable aortic bioprosthesis by followup echocardiogram this month, mean gradient 7 mm mercury. He will need bacterial endocarditis prophylaxis for any dental procedures.

## 2011-02-06 NOTE — Patient Instructions (Signed)
Your physician recommends that you schedule a follow-up appointment in: 1 month Your physician recommends that you return for lab work in: 1 and 1/2 weeks Your physician has recommended you make the following change in your medication: start taking Spironalactone 25mg  (take 1/2 tablet daily) and Lisinopril 2.5 daily....continue to take Coreg
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
## 2011-02-06 NOTE — Assessment & Plan Note (Signed)
Fairly stable symptomatically, although his weight has increased some over the last month. Reports NYHA class II dyspnea on exertion at this time. Medical regimen is minimal. Plan to gradually reintroduce therapies, beginning with lisinopril 2.5 mg daily and Aldactone 12.5 mg daily. He had previously tolerated both of these, which can be further advanced as tolerated. He is to see Dr. Gala Romney in the CHF clinic over the next 2 weeks, at which time a followup BMET and BNP will be obtained. He may need standing Lasix ultimately. Candidacy for other potential device therapies can be discussed over time as well.

## 2011-02-07 ENCOUNTER — Telehealth: Payer: Self-pay | Admitting: Cardiology

## 2011-02-07 NOTE — Telephone Encounter (Signed)
States that Dr.McDowell prescribed Ampicillin but it was not called in to [facility] in [place] / tg

## 2011-02-08 ENCOUNTER — Telehealth: Payer: Self-pay

## 2011-02-08 NOTE — Telephone Encounter (Signed)
[facility] pharmacy states that they received fax for Lisinopril and Aldactone but not for Ampicillin. RX ordered over phone, pt. Is aware.

## 2011-02-15 ENCOUNTER — Ambulatory Visit (INDEPENDENT_AMBULATORY_CARE_PROVIDER_SITE_OTHER): Payer: Medicaid Other | Admitting: Internal Medicine

## 2011-02-15 ENCOUNTER — Encounter: Payer: Self-pay | Admitting: Internal Medicine

## 2011-02-15 VITALS — BP 102/72 | HR 80 | Resp 18 | Ht 71.0 in | Wt 129.8 lb

## 2011-02-15 DIAGNOSIS — I359 Nonrheumatic aortic valve disorder, unspecified: Secondary | ICD-10-CM

## 2011-02-15 DIAGNOSIS — I5022 Chronic systolic (congestive) heart failure: Secondary | ICD-10-CM

## 2011-02-15 DIAGNOSIS — Z954 Presence of other heart-valve replacement: Secondary | ICD-10-CM

## 2011-02-15 MED ORDER — CARVEDILOL 6.25 MG PO TABS: 6.2500 mg | ORAL_TABLET | Freq: Two times a day (BID) | ORAL | Status: DC

## 2011-02-15 NOTE — Assessment & Plan Note (Signed)
Has made an amazing clinical recovery. However EF still remains markedly depressed. Reinforced need to titrated HF medications and continue to abstain from ETOH completely to maximize chances of recovery. Currently NYHA II. Volume status looks good. Will titrate carvedilol to 6.25 bid (can start with night dose only, if needed). We also discussed role of ICD, as it looks like EF may not recover completely, will refer to Dr. Graciela Husbands for possible prophylactic ICD. He has labs scheduled for tomorrow. We will see back every 3-4 weeks to titrate meds.

## 2011-02-15 NOTE — Progress Notes (Signed)
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 He was admitted to [facility] in January with acute on chronic systolic heart failure complicated by cardiogenic shock. He was eventually transferred to [facility] and underwent bioprosthetic AoV replacement. Peri-op course was complicated by a VF arrest prior to going to the OR.  F/u echo in March 2012, showed a well-functioning valve, however EF remains depressed at 20%.   He presents for HF follow-up.  Overall doing very well. Several days a week walks 1-2 miles without much problem. Initially had to stop to take breaks but now can walk without stopping. No orthopnea, edema or PND. No longer requiring lasix. Saw Dr. Diona Browner a week ago and started on spironolactone. Compliant with all meds. Occasionally dizzy if he stands up too quickly. No syncope.   Beginning to put some lean weight back on. States that he quit smoking 6 months ago, and has not resumed any alcohol intake since September. Having teeth pulled at [facility].   j ROS: All systems negative except as listed in HPI, PMH and Problem List.  Past Medical History  Diagnosis Date  . COPD (chronic obstructive pulmonary disease)   . Biventricular CHF (congestive heart failure)   . 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 Aortic insufficiency     Moderate  . Aortic stenosis     Severe, bicuspid aortic valve    Current Outpatient Prescriptions  Medication Sig Dispense Refill  . ampicillin (PRINCIPEN) 250 MG capsule Take 1 capsule (250 mg total) by mouth every 6 (six) hours. Needs this before dental procedures: take 1 capsule by month every 6 hrs. for 2 weeks  56 capsule  0  . aspirin 81 MG tablet Take 81 mg by mouth daily.        . carvedilol (COREG) 3.125 MG tablet Take 3.125 mg by mouth 2 (two) times daily with a meal.        . famotidine (PEPCID) 20 MG  tablet Take 20 mg by mouth 2 (two) times daily.        . folic acid (FOLVITE) 1 MG tablet Take 1 mg by mouth daily.        Marland Kitchen lisinopril (PRINIVIL,ZESTRIL) 2.5 MG tablet Take 1 tablet (2.5 mg total) by mouth daily.  30 tablet  3  . Multiple Vitamins-Minerals (MULTIVITAMIN WITH MINERALS) tablet Take 1 tablet by mouth daily.        Marland Kitchen spironolactone (ALDACTONE) 25 MG tablet Take 25 mg by mouth daily.        Marland Kitchen thiamine 100 MG tablet Take 100 mg by mouth daily.        Marland Kitchen tiotropium (SPIRIVA) 18 MCG inhalation capsule Place 18 mcg into inhaler and inhale daily.        . vitamin B-12 (CYANOCOBALAMIN) 250 MCG tablet Take 250 mcg by mouth daily.        Marland Kitchen DISCONTD: spironolactone (ALDACTONE) 25 MG tablet Take 0.5 tablets (12.5 mg total) by mouth daily.  15 tablet  3  . DISCONTD: Amino Acids-Protein Hydrolys (PROTEINEX) LIQD Take by mouth 2 (two) times daily.           PHYSICAL EXAM: Filed Vitals:   02/15/11 0935  BP: 102/72  Pulse: 80  Resp: 18   General:  Thin  Well appearing. No resp difficulty HEENT: normal except for dentition  Neck: supple. JVP flat. Carotids 2+ bilaterally; no bruits. No lymphadenopathy or thryomegaly appreciated. Cor: PMI normal. Regular rate & rhythm. No rubs, gallops or murmurs.  Well healed CABG scar. Lungs: clear Abdomen: soft, nontender, nondistended. No hepatosplenomegaly. No bruits or masses. Good bowel sounds. Extremities: no cyanosis, clubbing, rash, edema Neuro: alert & orientedx3, cranial nerves grossly intact. Moves all 4 extremities w/o difficulty. Affect pleasant.    ECG: NSR 74 LVH with repol.    ASSESSMENT & PLAN:

## 2011-02-15 NOTE — Assessment & Plan Note (Signed)
Valve looks good on echo and sounds good on exam.

## 2011-02-15 NOTE — Patient Instructions (Signed)
Increase Carvedilol (Coreg) to 6.25mg  Twice daily  You have been referred to Dr Graciela Husbands Your physician recommends that you schedule a follow-up appointment in: 3-4 weeks with Dr Gala Romney

## 2011-02-16 ENCOUNTER — Other Ambulatory Visit: Payer: Self-pay | Admitting: Cardiology

## 2011-02-17 ENCOUNTER — Other Ambulatory Visit: Payer: Self-pay | Admitting: Cardiology

## 2011-02-17 LAB — BASIC METABOLIC PANEL
BUN: 5 mg/dL — ABNORMAL LOW (ref 6–23)
Potassium: 4.5 mEq/L (ref 3.5–5.3)
Sodium: 138 mEq/L (ref 135–145)

## 2011-02-17 LAB — BRAIN NATRIURETIC PEPTIDE: Brain Natriuretic Peptide: 43 pg/mL (ref 0.0–100.0)

## 2011-02-19 ENCOUNTER — Telehealth: Payer: Self-pay | Admitting: Cardiology

## 2011-02-19 NOTE — Telephone Encounter (Signed)
Would like to know if there is any antibiotic pre-med needed prior to dental work /  please fax written instructions to  [phone] / tg

## 2011-02-23 ENCOUNTER — Telehealth: Payer: Self-pay | Admitting: Internal Medicine

## 2011-02-23 NOTE — Telephone Encounter (Signed)
Refill carvedilol at [facility]

## 2011-02-24 NOTE — Telephone Encounter (Signed)
Already done

## 2011-03-13 ENCOUNTER — Encounter: Payer: Self-pay | Admitting: Cardiology

## 2011-03-13 ENCOUNTER — Ambulatory Visit (INDEPENDENT_AMBULATORY_CARE_PROVIDER_SITE_OTHER): Payer: Medicaid Other | Admitting: Cardiology

## 2011-03-13 VITALS — BP 119/77 | HR 80 | Ht 71.0 in | Wt 132.0 lb

## 2011-03-13 DIAGNOSIS — K219 Gastro-esophageal reflux disease without esophagitis: Secondary | ICD-10-CM | POA: Insufficient documentation

## 2011-03-13 DIAGNOSIS — Z954 Presence of other heart-valve replacement: Secondary | ICD-10-CM

## 2011-03-13 DIAGNOSIS — I429 Cardiomyopathy, unspecified: Secondary | ICD-10-CM

## 2011-03-13 DIAGNOSIS — I5022 Chronic systolic (congestive) heart failure: Secondary | ICD-10-CM

## 2011-03-13 DIAGNOSIS — I359 Nonrheumatic aortic valve disorder, unspecified: Secondary | ICD-10-CM

## 2011-03-13 DIAGNOSIS — Z01818 Encounter for other preprocedural examination: Secondary | ICD-10-CM

## 2011-03-13 MED ORDER — LISINOPRIL 2.5 MG PO TABS: 2.5000 mg | ORAL_TABLET | Freq: Two times a day (BID) | ORAL | Status: DC

## 2011-03-13 MED ORDER — ESOMEPRAZOLE MAGNESIUM 20 MG PO PACK: 20.0000 mg | PACK | Freq: Every day | ORAL | Status: DC

## 2011-03-13 NOTE — Assessment & Plan Note (Signed)
History of severe aortic valve stenosis with aortic regurgitation, status post bioprosthetic valve replacement at [facility] in January. Patient doing remarkably well. He will need antibiotic prophylaxis for dental work.

## 2011-03-13 NOTE — Assessment & Plan Note (Signed)
Dr. Ocie Doyne plans dental extraction in the hospital setting under general anesthesia. Patient should be able to proceed at an acceptable, overall moderate, perioperative risk in light of his comorbidities. He has been clinically stable however, doing quite well medical therapy, no recent decompensation in symptoms. I would think that Dr. Graciela Husbands would prefer that dental extractions occur prior to considering prophylactic ICD placement. Patient is due to see him in one week. Our cardiology service can certainly follow [name] is an inpatient if necessary.

## 2011-03-13 NOTE — Assessment & Plan Note (Signed)
Clinically stable, NYHA class II dyspnea on exertion at this time. Increase lisinopril to 2.5 mg p.o. B.i.d., otherwise continue present regimen. Continue regular walking regimen. Followup scheduled with Dr. Gala Romney in the CHF clinic. BMET in 2 weeks.
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
## 2011-03-13 NOTE — Patient Instructions (Signed)
**Note De-Identified Mishon Blubaugh Obfuscation** Your physician has recommended you make the following change in your medication: increase Lisinopril 2.5mg  from once daily to twice daily  Your physician recommends that you return for lab work in: 2 weeks  Your physician recommends that you schedule a follow-up appointment in: 4 weeks

## 2011-03-13 NOTE — Progress Notes (Signed)
Pt's OV note giving surgical clearance for teeth extractions faxed to Dr. Ocie Doyne @ [phone].

## 2011-03-13 NOTE — Assessment & Plan Note (Signed)
Nonischemic, LVEF 20-25%. Visit scheduled with Dr. Graciela Husbands in the next week to discuss prophylactic ICD placement.

## 2011-03-13 NOTE — Assessment & Plan Note (Signed)
Switched from Pepcid to Nexium.

## 2011-03-15 ENCOUNTER — Encounter: Payer: Self-pay | Admitting: Internal Medicine

## 2011-03-19 ENCOUNTER — Ambulatory Visit (INDEPENDENT_AMBULATORY_CARE_PROVIDER_SITE_OTHER): Payer: Medicaid Other | Admitting: Internal Medicine

## 2011-03-19 ENCOUNTER — Encounter: Payer: Self-pay | Admitting: Internal Medicine

## 2011-03-19 DIAGNOSIS — I429 Cardiomyopathy, unspecified: Secondary | ICD-10-CM

## 2011-03-19 DIAGNOSIS — T148 Other injury of unspecified body region: Secondary | ICD-10-CM

## 2011-03-19 DIAGNOSIS — I5022 Chronic systolic (congestive) heart failure: Secondary | ICD-10-CM

## 2011-03-19 DIAGNOSIS — W57XXXA Bitten or stung by nonvenomous insect and other nonvenomous arthropods, initial encounter: Secondary | ICD-10-CM

## 2011-03-19 MED ORDER — DOXYCYCLINE HYCLATE 100 MG PO CAPS: 100.0000 mg | ORAL_CAPSULE | Freq: Two times a day (BID) | ORAL | Status: AC

## 2011-03-19 NOTE — Assessment & Plan Note (Signed)
With tick bite and surrounjding erythma will begin on doxycycline

## 2011-03-19 NOTE — Patient Instructions (Signed)
Your physician has recommended you make the following change in your medication:  1) start doxycycline 100mg  one tablet twice daily for 14 days.  Please call Dr. Odessa Fleming nurse, Sherri Rad, RN, BSN , after you have your teeth extracted and we will set you up to have your defibrillator implanted.
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
## 2011-03-19 NOTE — Assessment & Plan Note (Signed)
Stable on current medications 

## 2011-03-19 NOTE — Assessment & Plan Note (Signed)
The patient has persistent left ventricular dysfunction and class II symptoms in the setting of his nonischemic cardiomyopathy. He is on optimal pharmacotherapy. It is appropriate at this time to consider ICD implantation for primary prevention. As appreciated by Dr. Diona Browner, getting his teeth removed prior to implant makes the most sense from an infection risk point of view  The procedural benefits and risks were reviewed including but not limited to death, perforation, lead dislodgement, infection, device malfunction and inappropriate shocks.  The patient understands agrees and is willing to proceed

## 2011-03-27 ENCOUNTER — Telehealth: Payer: Self-pay | Admitting: Internal Medicine

## 2011-03-27 NOTE — Telephone Encounter (Signed)
Pt wants to know if his procedure can be reschedule his procedure before the end of June because his insurance ends at the end of June.

## 2011-03-27 NOTE — Telephone Encounter (Signed)
I will forward this information to Sherri Rad RN to schedule pt for ICD implant.  Dental extraction scheduled on 04/13/11.

## 2011-03-27 NOTE — Telephone Encounter (Signed)
Spoke with pt and told him I would forward message to Sherri Rad, RN  and she would call him when she is back in office tomorrow.

## 2011-03-27 NOTE — Telephone Encounter (Signed)
Pt suppose to have teeth extraction on 6/8. Pt need defib implant before the end of June.

## 2011-03-28 ENCOUNTER — Telehealth: Payer: Self-pay | Admitting: Internal Medicine

## 2011-03-28 ENCOUNTER — Encounter: Payer: Self-pay | Admitting: *Deleted

## 2011-03-28 LAB — BASIC METABOLIC PANEL
CO2: 22 mEq/L (ref 19–32)
Calcium: 10.3 mg/dL (ref 8.4–10.5)
Chloride: 101 mEq/L (ref 96–112)
Sodium: 135 mEq/L (ref 135–145)

## 2011-03-28 NOTE — Telephone Encounter (Signed)
Spoke with the pt.  

## 2011-03-28 NOTE — Telephone Encounter (Signed)
Pt calling setting up procedure

## 2011-03-28 NOTE — Telephone Encounter (Signed)
I spoke with the patient. We will set him up for 6/27 for ICD implant. The patient will call back when he gets home so he can write down instructions.

## 2011-03-29 ENCOUNTER — Encounter: Payer: Self-pay | Admitting: *Deleted

## 2011-04-05 ENCOUNTER — Encounter: Payer: Self-pay | Admitting: Internal Medicine

## 2011-04-05 ENCOUNTER — Ambulatory Visit (INDEPENDENT_AMBULATORY_CARE_PROVIDER_SITE_OTHER): Payer: Medicaid Other | Admitting: Internal Medicine

## 2011-04-05 VITALS — BP 104/74 | HR 60 | Resp 18 | Ht 71.0 in | Wt 132.0 lb

## 2011-04-05 DIAGNOSIS — K30 Functional dyspepsia: Secondary | ICD-10-CM

## 2011-04-05 DIAGNOSIS — R1013 Epigastric pain: Secondary | ICD-10-CM

## 2011-04-05 DIAGNOSIS — K219 Gastro-esophageal reflux disease without esophagitis: Secondary | ICD-10-CM

## 2011-04-05 DIAGNOSIS — K3189 Other diseases of stomach and duodenum: Secondary | ICD-10-CM

## 2011-04-05 DIAGNOSIS — I5022 Chronic systolic (congestive) heart failure: Secondary | ICD-10-CM

## 2011-04-05 MED ORDER — LISINOPRIL 5 MG PO TABS: 5.0000 mg | ORAL_TABLET | Freq: Two times a day (BID) | ORAL | Status: DC

## 2011-04-05 NOTE — Progress Notes (Signed)
HPI:   Benjamin [name] is 55 year old male with h/o severe CHF due to NICM (thought due to ETOH) and severe aortic stenosis in the setting of a bicuspid aortic valve. He was admitted to [facility] in January with acute on chronic systolic heart failure complicated by cardiogenic shock. He was eventually transferred to [facility] and underwent bioprosthetic AoV replacement. Peri-op course was complicated by a VF arrest prior to going to the OR.  F/u echo in March 2012, showed a well-functioning valve, however EF remains depressed at 20%.   He presents for HF follow-up. Has seen Dr. Graciela Husbands and scheduled for ICD implant on June 27 after he gets teeth out on June 8.   Overall doing very well. Walking 3-5 miles every day without much problem. Initially had to stop to take breaks but now can walk without stopping. No orthopnea, edema or PND. No longer requiring lasix. Only complaint is that he has to burp frequently. Says it is a longstanding problem but seems to have gotten worse since after his surgery. No ab pain. BMs normal. Appetite just ok. No nausea or vomiting. Weight back to baseline.  Still occasionally dizzy when when he stands up but this is getting better. BMET 1 week ago. K 4.2 Cr 0.77  Has not resumed any alcohol intake since September.   ROS: All systems negative except as listed in HPI, PMH and Problem List.  Past Medical History  Diagnosis Date  . COPD (chronic obstructive pulmonary disease)   . Biventricular CHF (congestive heart failure)     LVEF 20%  . 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 Aortic insufficiency     Moderate  . Aortic stenosis     Severe, bicuspid aortic valve. s/p bioprosthetic  AVR 1/12  . Alcohol abuse, in remission     Stopped in 9/11    Current Outpatient Prescriptions  Medication Sig Dispense Refill  . aspirin 81 MG tablet Take 81 mg by mouth daily.        . carvedilol (COREG) 6.25 MG tablet Take 1 tablet (6.25 mg total) by mouth  2 (two) times daily with a meal.  60 tablet  6  . esomeprazole (NEXIUM) 20 MG packet Take 20 mg by mouth daily before breakfast.  30 each  3  . folic acid (FOLVITE) 1 MG tablet Take 1 mg by mouth daily.        Marland Kitchen lisinopril (PRINIVIL,ZESTRIL) 2.5 MG tablet Take 1 tablet (2.5 mg total) by mouth 2 (two) times daily.  60 tablet  3  . Multiple Vitamins-Minerals (MULTIVITAMIN WITH MINERALS) tablet Take 1 tablet by mouth daily.        Marland Kitchen spironolactone (ALDACTONE) 25 MG tablet Take 12.5 mg by mouth daily.       Marland Kitchen thiamine 100 MG tablet Take 100 mg by mouth daily.        . Thiamine HCl (VITAMIN B-1) 250 MG tablet Take 250 mg by mouth daily.        Marland Kitchen tiotropium (SPIRIVA) 18 MCG inhalation capsule Place 18 mcg into inhaler and inhale daily.        . vitamin B-12 (CYANOCOBALAMIN) 250 MCG tablet Take 250 mcg by mouth daily.        Marland Kitchen DISCONTD: Amino Acids-Protein Hydrolys (PROTEINEX) LIQD Take by mouth 2 (two) times daily.        Marland Kitchen DISCONTD: famotidine (PEPCID) 20 MG tablet Take 20 mg by mouth 2 (two) times daily.        Marland Kitchen  DISCONTD: thiamine 100 MG tablet Take 100 mg by mouth daily.           PHYSICAL EXAM: Filed Vitals:   04/05/11 0939  BP: 104/74  Pulse: 60  Resp: 18   General:  Thin  Well appearing. No resp difficulty HEENT: normal except for dentition Neck: supple. JVP flat. Carotids 2+ bilaterally; no bruits. No lymphadenopathy or thryomegaly appreciated. Cor: PMI normal. Regular rate & rhythm. No rubs, gallops or murmurs.  Well healed CABG scar. Lungs: clear Abdomen: soft, nontender, nondistended. No hepatosplenomegaly. No bruits or masses. Good bowel sounds. Extremities: no cyanosis, clubbing, rash, edema Neuro: alert & orientedx3, cranial nerves grossly intact. Moves all 4 extremities w/o difficulty. Affect pleasant.    ECG: NSR 60 LVH with repol    ASSESSMENT & PLAN:

## 2011-04-05 NOTE — Assessment & Plan Note (Signed)
Having significant dyspepsia. Will continue PPI and refer to GI.

## 2011-04-05 NOTE — Patient Instructions (Signed)
Increase Lisinopril to 5 mg Twice daily  You have been referred to GI for Indigestion Your physician recommends that you schedule a follow-up appointment in: 1 month

## 2011-04-05 NOTE — Assessment & Plan Note (Signed)
Doing well NYHA I. Volume status looks good. Increase lisinopril to 5 bid. Given small stature max dosing may be lower than for larger patients. We will see what he can tolerate. For ICD in June after teeth extracted.

## 2011-04-12 ENCOUNTER — Ambulatory Visit: Payer: Medicaid Other | Admitting: Cardiology

## 2011-04-12 ENCOUNTER — Ambulatory Visit (HOSPITAL_COMMUNITY)
Admission: RE | Admit: 2011-04-12 | Discharge: 2011-04-12 | Disposition: A | Discharge status: Home / Self Care | Payer: Medicaid Other | Source: Ambulatory Visit | Attending: Oral Surgery | Admitting: Oral Surgery

## 2011-04-12 ENCOUNTER — Encounter (HOSPITAL_COMMUNITY)
Admission: RE | Admit: 2011-04-12 | Discharge: 2011-04-12 | Disposition: A | Discharge status: Home / Self Care | Payer: Medicaid Other | Source: Ambulatory Visit | Attending: Oral Surgery | Admitting: Oral Surgery

## 2011-04-12 ENCOUNTER — Other Ambulatory Visit (HOSPITAL_COMMUNITY): Payer: Self-pay | Admitting: Oral Surgery

## 2011-04-12 DIAGNOSIS — K0889 Other specified disorders of teeth and supporting structures: Secondary | ICD-10-CM

## 2011-04-12 DIAGNOSIS — I10 Essential (primary) hypertension: Secondary | ICD-10-CM | POA: Insufficient documentation

## 2011-04-12 DIAGNOSIS — Z01812 Encounter for preprocedural laboratory examination: Secondary | ICD-10-CM | POA: Insufficient documentation

## 2011-04-12 DIAGNOSIS — J438 Other emphysema: Secondary | ICD-10-CM | POA: Insufficient documentation

## 2011-04-12 DIAGNOSIS — Z01818 Encounter for other preprocedural examination: Secondary | ICD-10-CM | POA: Insufficient documentation

## 2011-04-12 DIAGNOSIS — Z01810 Encounter for preprocedural cardiovascular examination: Secondary | ICD-10-CM | POA: Insufficient documentation

## 2011-04-12 LAB — CBC
HCT: 39.1 % (ref 39.0–52.0)
Hemoglobin: 13.5 g/dL (ref 13.0–17.0)
MCH: 30.1 pg (ref 26.0–34.0)
MCHC: 34.5 g/dL (ref 30.0–36.0)
MCV: 87.1 fL (ref 78.0–100.0)
RBC: 4.49 MIL/uL (ref 4.22–5.81)

## 2011-04-12 LAB — COMPREHENSIVE METABOLIC PANEL
ALT: 41 U/L (ref 0–53)
BUN: 6 mg/dL (ref 6–23)
CO2: 31 mEq/L (ref 19–32)
Calcium: 9.7 mg/dL (ref 8.4–10.5)
GFR calc non Af Amer: >60 mL/min (ref >60)
Glucose, Bld: 84 mg/dL (ref 70–99)
Sodium: 137 mEq/L (ref 135–145)
Total Protein: 7.2 g/dL (ref 6.0–8.3)

## 2011-04-13 ENCOUNTER — Ambulatory Visit (HOSPITAL_COMMUNITY)
Admission: RE | Admit: 2011-04-13 | Discharge: 2011-04-14 | Disposition: A | Discharge status: Home / Self Care | Payer: Medicaid Other | Source: Ambulatory Visit | Attending: Oral Surgery | Admitting: Oral Surgery

## 2011-04-13 DIAGNOSIS — Z01818 Encounter for other preprocedural examination: Secondary | ICD-10-CM | POA: Insufficient documentation

## 2011-04-13 DIAGNOSIS — I5022 Chronic systolic (congestive) heart failure: Secondary | ICD-10-CM | POA: Insufficient documentation

## 2011-04-13 DIAGNOSIS — K219 Gastro-esophageal reflux disease without esophagitis: Secondary | ICD-10-CM | POA: Insufficient documentation

## 2011-04-13 DIAGNOSIS — Z954 Presence of other heart-valve replacement: Secondary | ICD-10-CM | POA: Insufficient documentation

## 2011-04-13 DIAGNOSIS — I359 Nonrheumatic aortic valve disorder, unspecified: Secondary | ICD-10-CM | POA: Insufficient documentation

## 2011-04-13 DIAGNOSIS — K0889 Other specified disorders of teeth and supporting structures: Secondary | ICD-10-CM | POA: Insufficient documentation

## 2011-04-13 DIAGNOSIS — M278 Other specified diseases of jaws: Secondary | ICD-10-CM | POA: Insufficient documentation

## 2011-04-13 DIAGNOSIS — I428 Other cardiomyopathies: Secondary | ICD-10-CM | POA: Insufficient documentation

## 2011-04-16 NOTE — Op Note (Signed)
Benjamin [name], Benjamin [name]                  ACCOUNT NO.:  [id number]  MEDICAL RECORD NO.:  [id number]  LOCATION:  5126                         FACILITY:  [facility]  PHYSICIAN:  Georgia Lopes, M.D.  DATE OF BIRTH:  07/23/1956  DATE OF PROCEDURE:  04/13/2011 DATE OF DISCHARGE:                              OPERATIVE REPORT   PREOPERATIVE DIAGNOSIS:  Nonrestorable teeth #2, 3, 4, 5, 6, 7, 10, 11, 12, 14, 15, 23, 24, 25, 26, right and left maxillary buccal exostosis, bilateral mandibular tori.  POSTOPERATIVE DIAGNOSIS:  Nonrestorable teeth #2, 3, 4, 5, 6, 7, 10, 11, 12, 14, 15, 23, 24, 25, 26, right and left maxillary buccal exostosis, bilateral mandibular tori.  PROCEDURE:  Removal of teeth numbers as per above, alveoplasty of maxilla and mandible, removal of bilateral maxillary exostoses and removal of bilateral mandibular tori.  SURGEON:  Georgia Lopes, MD  ANESTHESIA:  General, Dr. Sampson Goon attending.  ASSISTANT:  Luberta Mutter, DOMA  INDICATIONS FOR PROCEDURE:  Benjamin [name] is a 55 year old male who was referred to me by his general dentist for removal of all remaining teeth.  PAST MEDICAL HISTORY:  Significant for chronic systolic heart failure, aortic valve disorders, cardiomyopathy, heart valve replacement, gastroesophageal reflux disease.  Because of these medical issues in the number of teeth, extensiveness of surgery that needed to be performed. It was recommended that the patient be anesthetized for general anesthesia in a hospital setting where an endotracheal tube could be placed.  PROCEDURE IN DETAIL:  The patient was taken to the operating room, placed on table in supine position.  General anesthesia was administered intravenously and a nasal endotracheal tube was placed and secured.  The eyes were protected.  Posterior pharynx was suctioned and throat pack was placed.  A 1% lidocaine was administered 1:100,000 epinephrine in an inferior alveolar block on the right and left  side and buccal and palatal infiltration in the maxilla, total of 20 mL was utilized.  The left side was operated first.  A bite block was placed in the right side of the mouth, 15 blade was used to make a full-thickness incision around teeth #23, 24, 25, and 26, and the incision was extended proximally approximately 2.5 cm on the right and left mandibular alveolar ridge crest.  The periosteum was reflected buccally and lingually.  The teeth were elevated with a 301 elevator and removed with the Asch forceps. The sockets were curetted.  Then, the lobes were exposed with the periosteal elevator and the Selden retractor was placed to retract the lingual periosteum.  The bony exostosis was removed on the left side and then on the right side with the egg-shaped bur and bone file.  Then alveoplasty was performed in the anterior mandible using the egg-shaped bur and bone file.  Then the area was irrigated and closed with 3-0 chromic, maxilla.  A 15 blade was used make a full-thickness incision around teeth #10, 11, 12, 14, 15.  The periosteum was reflected buccally and palatally and a proximal bone was removed with a handpiece.  The teeth were elevated with a 301 elevator and removed from the mouth with the Upper Universal forceps.  The  sockets were curetted.  Then the periosteum was further reflected to expose the alveolar ridge.  There was a large buccal exostosis in the posterior maxilla teeth #14 and 15, this was reduced with the egg-shaped bur and then the alveoplasty was performed in the egg-shaped bur, a bone file was then used to smooth all the bone in the left maxilla.  The area was irrigated and closed with 3- 0 chromic.  The bite block and sweetheart retractor were repositioned for access to the right side of the mouth, 15 blade was used to make a full-thickness incision buccally and palatally around teeth #2, 3, 4, 5, 6, and 7.  The periosteum was reflected.  Interproximal bone was  removed and the teeth were elevated with a 301 elevator and removed from the mouth with the Upper Universal forceps.  The sockets were curetted and the periosteum was further reflected to expose the exostosis which was approximately 1.5 cm x 1.0 cm and the area of tooth #2 and 3 on the buccal aspect.  The remaining bone was exposed as well in the right maxilla on the buccal aspect to expose the alveolar ridge crest.  Then the egg-shaped bur was used to remove this exostosis and perform the alveoplasty.  A bone file was then further used to smooth the bone.  The area was irrigated and closed with 3-0 chromic.  Then the oral cavity was inspected, found to have good contour and closure.  The oral cavity was irrigated copiously.  Throat pack was removed after suctioning.  The patient was awakened and taken to the recovery room, breathing spontaneously in good condition.  ESTIMATED BLOOD LOSS:  Minimum.  COMPLICATIONS:  None.  SPECIMENS:  None.  The patient was scheduled for 23-hour observation as he had no person to be able to watch him at home.  He was given a prescription for Percocet 5/325 #40 one to two every 4-6 hours p.r.n. pain, given instructions to keep ice on his face, to elevate the head of bed and use warm saline rinses the day after surgery.  He was given a postoperative appointment for 2 weeks afterwards.     Georgia Lopes, M.D.     SMJ/MEDQ  D:  04/13/2011  T:  04/14/2011  Job:  [id number]  Electronically Signed by Ocie Doyne M.D. on 04/16/2011 09:39:44 AM

## 2011-04-19 ENCOUNTER — Ambulatory Visit (INDEPENDENT_AMBULATORY_CARE_PROVIDER_SITE_OTHER): Payer: Medicaid Other | Admitting: Cardiology

## 2011-04-19 ENCOUNTER — Encounter: Payer: Self-pay | Admitting: Cardiology

## 2011-04-19 VITALS — BP 102/58 | HR 67 | Ht 71.0 in | Wt 128.0 lb

## 2011-04-19 DIAGNOSIS — I429 Cardiomyopathy, unspecified: Secondary | ICD-10-CM

## 2011-04-19 DIAGNOSIS — Z954 Presence of other heart-valve replacement: Secondary | ICD-10-CM

## 2011-04-19 DIAGNOSIS — I5022 Chronic systolic (congestive) heart failure: Secondary | ICD-10-CM

## 2011-04-19 DIAGNOSIS — I359 Nonrheumatic aortic valve disorder, unspecified: Secondary | ICD-10-CM
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
## 2011-04-19 NOTE — Assessment & Plan Note (Signed)
Clinically stable. 

## 2011-04-19 NOTE — Patient Instructions (Addendum)
**Note De-identified Mahli Glahn Obfuscation** Your physician recommends that you continue on your current medications as directed. Please refer to the Current Medication list given to you today.  Your physician recommends that you schedule a follow-up appointment in: 6 weeks  

## 2011-04-19 NOTE — Assessment & Plan Note (Signed)
Clinically stable, NYHA class I symptoms. Recent medication adjustments noted. Patient tolerating his present regimen well. No changes made today. Recent followup lab work showed normal renal function and potassium.

## 2011-04-19 NOTE — Assessment & Plan Note (Signed)
Nonischemic cardiomyopathy, LVEF approximately 20%. Patient scheduled for ICD placement with Dr. Graciela Husbands on June 27.

## 2011-04-26 ENCOUNTER — Other Ambulatory Visit: Payer: Self-pay | Admitting: Internal Medicine

## 2011-04-27 LAB — BASIC METABOLIC PANEL
Calcium: 9.7 mg/dL (ref 8.4–10.5)
Potassium: 5.2 mEq/L (ref 3.5–5.3)
Sodium: 134 mEq/L — ABNORMAL LOW (ref 135–145)

## 2011-04-27 LAB — CBC WITH DIFFERENTIAL/PLATELET
Basophils Absolute: 0.1 10*3/uL (ref 0.0–0.1)
HCT: 38.3 % — ABNORMAL LOW (ref 39.0–52.0)
Lymphocytes Relative: 32 % (ref 12–46)
Lymphs Abs: 2 10*3/uL (ref 0.7–4.0)
MCV: 91.8 fL (ref 78.0–100.0)
Monocytes Absolute: 0.5 10*3/uL (ref 0.1–1.0)
Neutro Abs: 3.7 10*3/uL (ref 1.7–7.7)
Platelets: 338 10*3/uL (ref 150–400)
RBC: 4.17 MIL/uL — ABNORMAL LOW (ref 4.22–5.81)
RDW: 15.7 % — ABNORMAL HIGH (ref 11.5–15.5)
WBC: 6.5 10*3/uL (ref 4.0–10.5)

## 2011-04-27 LAB — PROTIME-INR: INR: 1.12 (ref <1.50)

## 2011-05-02 ENCOUNTER — Ambulatory Visit (HOSPITAL_COMMUNITY)
Admission: RE | Admit: 2011-05-02 | Discharge: 2011-05-02 | Disposition: A | Discharge status: Home / Self Care | Payer: Medicaid Other | Source: Ambulatory Visit | Attending: Internal Medicine | Admitting: Internal Medicine

## 2011-05-02 DIAGNOSIS — I428 Other cardiomyopathies: Secondary | ICD-10-CM | POA: Insufficient documentation

## 2011-05-02 DIAGNOSIS — Z538 Procedure and treatment not carried out for other reasons: Secondary | ICD-10-CM | POA: Insufficient documentation

## 2011-05-02 LAB — SURGICAL PCR SCREEN: MRSA, PCR: NEGATIVE

## 2011-05-07 ENCOUNTER — Encounter: Payer: Self-pay | Admitting: Internal Medicine

## 2011-05-07 ENCOUNTER — Ambulatory Visit (INDEPENDENT_AMBULATORY_CARE_PROVIDER_SITE_OTHER): Payer: Self-pay | Admitting: Internal Medicine

## 2011-05-07 VITALS — BP 104/68 | HR 68 | Ht 71.0 in | Wt 129.4 lb

## 2011-05-07 DIAGNOSIS — I5022 Chronic systolic (congestive) heart failure: Secondary | ICD-10-CM

## 2011-05-07 NOTE — Progress Notes (Signed)
HPI:   Benjamin [name] is 55 year old male with h/o severe CHF due to NICM (thought due to ETOH) and severe aortic stenosis in the setting of a bicuspid aortic valve. He was admitted to [facility] in January with acute on chronic systolic heart failure complicated by cardiogenic shock. He was eventually transferred to [facility] and underwent bioprosthetic AoV replacement. Peri-op course was complicated by a VF arrest prior to going to the OR.  F/u echo in March 2012, showed a well-functioning valve, however EF remains depressed at 20%.   He presents for HF follow-up. Has seen Dr. Graciela Husbands and scheduled for ICD implant on June 27 after he gets teeth out on June 8.   Overall doing very well. Walking 3-5 miles every day without much problem. Initially had to stop to take breaks but now can walk without stopping. No orthopnea, edema or PND. No longer requiring lasix. Only complaint is that he has to burp frequently. Says it is a longstanding problem but seems to have gotten worse since after his surgery. No ab pain. BMs normal. Appetite just ok. No nausea or vomiting. Weight back to baseline.  Still occasionally dizzy when when he stands up but this is getting better. BMET 1 week ago. K 4.2 Cr 0.77  Has not resumed any alcohol intake since September.   ROS: All systems negative except as listed in HPI, PMH and Problem List.  Past Medical History  Diagnosis Date  . COPD (chronic obstructive pulmonary disease)   . Biventricular CHF (congestive heart failure)     LVEF 20%  . 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 Aortic insufficiency     Moderate  . Aortic stenosis     Severe, bicuspid aortic valve. s/p bioprosthetic  AVR 1/12  . Alcohol abuse, in remission     Stopped in 9/11    Current Outpatient Prescriptions  Medication Sig Dispense Refill  . aspirin 81 MG tablet Take 81 mg by mouth daily.        . carvedilol (COREG) 6.25 MG tablet Take 1 tablet (6.25 mg total) by mouth  2 (two) times daily with a meal.  60 tablet  6  . esomeprazole (NEXIUM) 20 MG packet Take 20 mg by mouth daily before breakfast.  30 each  3  . folic acid (FOLVITE) 1 MG tablet Take 1 mg by mouth daily.        Marland Kitchen lisinopril (PRINIVIL,ZESTRIL) 5 MG tablet Take 1 tablet (5 mg total) by mouth 2 (two) times daily.  60 tablet  3  . Multiple Vitamins-Minerals (MULTIVITAMIN WITH MINERALS) tablet Take 1 tablet by mouth daily.        Marland Kitchen spironolactone (ALDACTONE) 25 MG tablet Take 12.5 mg by mouth daily.       . Thiamine HCl (VITAMIN B-1) 250 MG tablet Take 250 mg by mouth daily.        Marland Kitchen tiotropium (SPIRIVA) 18 MCG inhalation capsule Place 18 mcg into inhaler and inhale daily.        . vitamin B-12 (CYANOCOBALAMIN) 250 MCG tablet Take 250 mcg by mouth daily.        Marland Kitchen DISCONTD: oxyCODONE-acetaminophen (PERCOCET) 5-325 MG per tablet Take 1 tablet by mouth every 4 (four) hours as needed.        Marland Kitchen DISCONTD: thiamine 100 MG tablet Take 100 mg by mouth daily.           PHYSICAL EXAM: Filed Vitals:   05/07/11 2130  BP: 104/68  Pulse: 68   General:  Thin  Well appearing. No resp difficulty HEENT: normal except for dentition Neck: supple. JVP flat. Carotids 2+ bilaterally; no bruits. No lymphadenopathy or thryomegaly appreciated. Cor: PMI normal. Regular rate & rhythm. No rubs, gallops or murmurs.  Well healed CABG scar. Lungs: clear Abdomen: soft, nontender, nondistended. No hepatosplenomegaly. No bruits or masses. Good bowel sounds. Extremities: no cyanosis, clubbing, rash, edema Neuro: alert & orientedx3, cranial nerves grossly intact. Moves all 4 extremities w/o difficulty. Affect pleasant.    ECG: NSR 60 LVH with repol    ASSESSMENT & PLAN:

## 2011-05-07 NOTE — Patient Instructions (Signed)
Your physician wants you to follow-up in: 3-4 months.   You will receive a reminder letter in the mail two months in advance. If you don't receive a letter, please call our office to schedule the follow-up appointment.  

## 2011-05-09 NOTE — Assessment & Plan Note (Signed)
Doing very well from functional standpoint. NYHA I. Volume status looks good. However, EF remains significantly depressed. Med titration limited by low BP. Given NICM and class I not clear candidate for ICD but has had previous cardiac arrest (while at [facility] on inotropes) so will review with EP to see if he would qualify for ICD as 2ndary prevention.

## 2011-05-11 ENCOUNTER — Ambulatory Visit: Payer: Medicaid Other | Admitting: Internal Medicine

## 2011-05-28 ENCOUNTER — Encounter: Payer: Self-pay | Admitting: Cardiology

## 2011-05-31 ENCOUNTER — Encounter: Payer: Self-pay | Admitting: Cardiology

## 2011-05-31 ENCOUNTER — Ambulatory Visit (INDEPENDENT_AMBULATORY_CARE_PROVIDER_SITE_OTHER): Payer: Self-pay | Admitting: Cardiology

## 2011-05-31 VITALS — BP 88/61 | HR 95 | Ht 71.0 in | Wt 128.0 lb

## 2011-05-31 DIAGNOSIS — I359 Nonrheumatic aortic valve disorder, unspecified: Secondary | ICD-10-CM

## 2011-05-31 DIAGNOSIS — I5022 Chronic systolic (congestive) heart failure: Secondary | ICD-10-CM

## 2011-05-31 DIAGNOSIS — I429 Cardiomyopathy, unspecified: Secondary | ICD-10-CM

## 2011-05-31 DIAGNOSIS — Z954 Presence of other heart-valve replacement: Secondary | ICD-10-CM

## 2011-05-31 DIAGNOSIS — F1010 Alcohol abuse, uncomplicated: Secondary | ICD-10-CM

## 2011-05-31 NOTE — Assessment & Plan Note (Signed)
Clinically stable. 

## 2011-05-31 NOTE — Assessment & Plan Note (Signed)
Symptomatically stable, doing well, with NYHA class I symptoms. Blood pressure limits further medication titration. He is not overly dizzy however, and therefore no changes will be made today, specifically no reduction in dosing. Followup BMET for next visit.
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
## 2011-05-31 NOTE — Assessment & Plan Note (Signed)
Patient reports no further alcohol use.

## 2011-05-31 NOTE — Patient Instructions (Signed)
**Note De-Identified Benjamin [name] Obfuscation** Your physician recommends that you continue on your current medications as directed. Please refer to the Current Medication list given to you today.  Your physician recommends that you return for lab work in: 2 months, just before next visit.  Your physician recommends that you schedule a follow-up appointment in: 2 months

## 2011-05-31 NOTE — Assessment & Plan Note (Signed)
Nonischemic cardiomyopathy, plan to continue medical therapy.

## 2011-07-25 ENCOUNTER — Other Ambulatory Visit: Payer: Self-pay | Admitting: Cardiology

## 2011-07-26 LAB — BASIC METABOLIC PANEL
Chloride: 100 mEq/L (ref 96–112)
Potassium: 5.4 mEq/L — ABNORMAL HIGH (ref 3.5–5.3)
Sodium: 136 mEq/L (ref 135–145)

## 2011-07-31 ENCOUNTER — Encounter: Payer: Self-pay | Admitting: Cardiology

## 2011-08-01 ENCOUNTER — Other Ambulatory Visit: Payer: Self-pay

## 2011-08-01 DIAGNOSIS — I429 Cardiomyopathy, unspecified: Secondary | ICD-10-CM

## 2011-08-01 MED ORDER — SPIRONOLACTONE 12.5 MG HALF TABLET: 12.5000 mg | ORAL_TABLET | Freq: Every day | ORAL | Status: DC

## 2011-08-02 ENCOUNTER — Ambulatory Visit (INDEPENDENT_AMBULATORY_CARE_PROVIDER_SITE_OTHER): Payer: Self-pay | Admitting: Cardiology

## 2011-08-02 ENCOUNTER — Encounter: Payer: Self-pay | Admitting: Cardiology

## 2011-08-02 ENCOUNTER — Ambulatory Visit: Payer: Self-pay | Admitting: Cardiology

## 2011-08-02 VITALS — BP 103/70 | HR 103 | Resp 18 | Ht 71.0 in | Wt 127.0 lb

## 2011-08-02 DIAGNOSIS — I5022 Chronic systolic (congestive) heart failure: Secondary | ICD-10-CM

## 2011-08-02 DIAGNOSIS — E875 Hyperkalemia: Secondary | ICD-10-CM

## 2011-08-02 DIAGNOSIS — I359 Nonrheumatic aortic valve disorder, unspecified: Secondary | ICD-10-CM

## 2011-08-02 DIAGNOSIS — I429 Cardiomyopathy, unspecified: Secondary | ICD-10-CM

## 2011-08-02 DIAGNOSIS — Z954 Presence of other heart-valve replacement: Secondary | ICD-10-CM

## 2011-08-02 NOTE — Assessment & Plan Note (Signed)
Nonischemic. Patient continues to report no further alcohol use.

## 2011-08-02 NOTE — Assessment & Plan Note (Signed)
Symptomatically stable with NYHA class I symptoms. Heart rate is elevated today, however Benjamin [name] forgot his medicines yesterday, has not taken any yet today. I am reluctant to make any changes at this time. I would like to have him followup with a BMET to recheck potassium, as he is already on low-dose Aldactone. Creatinine has been normal. Followup scheduled.
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
## 2011-08-02 NOTE — Assessment & Plan Note (Signed)
Stable

## 2011-08-02 NOTE — Patient Instructions (Signed)
**Note De-Identified Metha Kolasa Obfuscation** Your physician recommends that you continue on your current medications as directed. Please refer to the Current Medication list given to you today.  Your physician recommends that you return for lab work in: 1 week  Your physician recommends that you schedule a follow-up appointment in: 6 weeks

## 2011-08-08 ENCOUNTER — Telehealth: Payer: Self-pay | Admitting: Cardiology

## 2011-08-08 NOTE — Telephone Encounter (Signed)
Pt. had BMET drawn on 07-26-11 and his potassium was elevated at 5.4. Dr. Diona Browner recommended that he decrease Aldactone to 12.5 mg daily and f/u BMET in 2 weeks. Pt. could not be reached by telephone so a letter was mailed to his address with Dr. Ival Bible recommendations. Pt. called office today stating that he is already taking12.5 mg of Aldactone daily and wants to know if he should have lab drawn. Please advise./LV

## 2011-08-08 NOTE — Telephone Encounter (Signed)
PER PT NEED SOME ONE TO GO OVER MEDICATIONS WITH HIM BEFORE HE GETS THEM REFILLED/TMJ

## 2011-08-17 LAB — BASIC METABOLIC PANEL
CO2: 26 mEq/L (ref 19–32)
Chloride: 103 mEq/L (ref 96–112)
Glucose, Bld: 73 mg/dL (ref 70–99)
Potassium: 4.6 mEq/L (ref 3.5–5.3)
Sodium: 138 mEq/L (ref 135–145)

## 2011-09-05 ENCOUNTER — Other Ambulatory Visit: Payer: Self-pay | Admitting: Cardiology

## 2011-09-05 ENCOUNTER — Other Ambulatory Visit: Payer: Self-pay | Admitting: Internal Medicine

## 2011-09-07 NOTE — Consult Note (Signed)
  NAMEEMAAD, NANNA NO.:  [id number]  MEDICAL RECORD NO.:  [id number]  LOCATION:  [facility]                         FACILITY:  [facility]  PHYSICIAN:  Duke Salvia, MD, FACCDATE OF BIRTH:  1956-08-20  DATE OF CONSULTATION:  05/02/2011 DATE OF DISCHARGE:  05/02/2011                                CONSULTATION   The patient presented for device implantation.  The patient had continued to improve.  No procedure was performed.  Hence no dictation is necessary for an operative procedure that was not performed.     Duke Salvia, MD, [facility]     SCK/MEDQ  D:  08/31/2011  T:  08/31/2011  Job:  [id number]  Electronically Signed by Sherryl Manges MD [facility] on 09/07/2011 05:58:48 PM

## 2011-09-10 ENCOUNTER — Other Ambulatory Visit: Payer: Self-pay | Admitting: Cardiology

## 2011-09-11 ENCOUNTER — Encounter: Payer: Self-pay | Admitting: Cardiology

## 2011-09-13 ENCOUNTER — Ambulatory Visit (INDEPENDENT_AMBULATORY_CARE_PROVIDER_SITE_OTHER): Payer: Self-pay | Admitting: Cardiology

## 2011-09-13 ENCOUNTER — Encounter: Payer: Self-pay | Admitting: *Deleted

## 2011-09-13 ENCOUNTER — Encounter: Payer: Self-pay | Admitting: Cardiology

## 2011-09-13 VITALS — BP 100/73 | HR 71 | Ht 71.0 in | Wt 131.0 lb

## 2011-09-13 DIAGNOSIS — I10 Essential (primary) hypertension: Secondary | ICD-10-CM

## 2011-09-13 DIAGNOSIS — Z954 Presence of other heart-valve replacement: Secondary | ICD-10-CM

## 2011-09-13 DIAGNOSIS — I429 Cardiomyopathy, unspecified: Secondary | ICD-10-CM

## 2011-09-13 DIAGNOSIS — I359 Nonrheumatic aortic valve disorder, unspecified: Secondary | ICD-10-CM

## 2011-09-13 DIAGNOSIS — I5022 Chronic systolic (congestive) heart failure: Secondary | ICD-10-CM
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
## 2011-09-13 NOTE — Assessment & Plan Note (Signed)
History of severe aortic valve stenosis with aortic regurgitation, status post bioprosthetic valve replacement at [facility].

## 2011-09-13 NOTE — Assessment & Plan Note (Signed)
Nonischemic cardiomyopathy. Patient continues to report complete cessation of prior alcohol use.

## 2011-09-13 NOTE — Patient Instructions (Addendum)
Your physician recommends that you schedule a follow-up appointment in: 3 months with Dr Diona Browner  Your physician has recommended you make the following change in your medication:    1- Stop thiamine and folic acid  Your physician recommends that you return for lab work in: 3 months (BMET), prior to your f/u visit  You will be mailed a letter

## 2011-09-13 NOTE — Assessment & Plan Note (Signed)
Doing well, NYHA class I symptoms on medical therapy. No changes made to present regimen other than simplifying his vitamin intake. He is due to see Dr. Gala Romney in the CHF clinic in December.

## 2011-10-15 ENCOUNTER — Other Ambulatory Visit: Payer: Self-pay | Admitting: Internal Medicine

## 2011-10-16 ENCOUNTER — Other Ambulatory Visit: Payer: Self-pay | Admitting: *Deleted

## 2011-10-16 DIAGNOSIS — I5022 Chronic systolic (congestive) heart failure: Secondary | ICD-10-CM

## 2011-10-16 MED ORDER — CARVEDILOL 6.25 MG PO TABS: 6.2500 mg | ORAL_TABLET | Freq: Two times a day (BID) | ORAL | Status: DC

## 2011-11-01 ENCOUNTER — Ambulatory Visit: Payer: Self-pay | Admitting: Internal Medicine

## 2011-11-20 ENCOUNTER — Encounter (HOSPITAL_COMMUNITY): Payer: Self-pay

## 2011-11-20 ENCOUNTER — Telehealth: Payer: Self-pay

## 2011-11-20 ENCOUNTER — Encounter (HOSPITAL_COMMUNITY): Payer: Self-pay | Admitting: Anesthesiology

## 2011-11-20 ENCOUNTER — Encounter (HOSPITAL_COMMUNITY)
Admission: EM | Disposition: A | Discharge status: Home / Self Care | Payer: Self-pay | Source: Home / Self Care | Attending: Neurosurgery

## 2011-11-20 ENCOUNTER — Ambulatory Visit (HOSPITAL_BASED_OUTPATIENT_CLINIC_OR_DEPARTMENT_OTHER)
Admission: RE | Admit: 2011-11-20 | Discharge: 2011-11-20 | Disposition: A | Discharge status: Home / Self Care | Payer: Medicaid Other | Source: Ambulatory Visit | Attending: Internal Medicine | Admitting: Internal Medicine

## 2011-11-20 ENCOUNTER — Emergency Department (HOSPITAL_COMMUNITY): Payer: Medicaid Other

## 2011-11-20 ENCOUNTER — Emergency Department (HOSPITAL_COMMUNITY): Payer: Medicaid Other | Admitting: Anesthesiology

## 2011-11-20 ENCOUNTER — Encounter (HOSPITAL_COMMUNITY): Payer: Self-pay | Admitting: Physical Medicine and Rehabilitation

## 2011-11-20 ENCOUNTER — Inpatient Hospital Stay (HOSPITAL_COMMUNITY)
Admission: EM | Admit: 2011-11-20 | Discharge: 2011-11-24 | DRG: 026 | Disposition: A | Discharge status: Home / Self Care | Payer: Medicaid Other | Attending: Neurosurgery | Admitting: Neurosurgery

## 2011-11-20 ENCOUNTER — Ambulatory Visit (HOSPITAL_COMMUNITY)
Admission: RE | Admit: 2011-11-20 | Discharge: 2011-11-20 | Disposition: A | Discharge status: Home / Self Care | Payer: Medicaid Other | Source: Ambulatory Visit | Attending: Internal Medicine | Admitting: Internal Medicine

## 2011-11-20 ENCOUNTER — Other Ambulatory Visit: Payer: Self-pay

## 2011-11-20 VITALS — BP 78/48 | HR 88 | Wt 130.0 lb

## 2011-11-20 DIAGNOSIS — J449 Chronic obstructive pulmonary disease, unspecified: Secondary | ICD-10-CM | POA: Diagnosis present

## 2011-11-20 DIAGNOSIS — R55 Syncope and collapse: Secondary | ICD-10-CM | POA: Insufficient documentation

## 2011-11-20 DIAGNOSIS — W19XXXA Unspecified fall, initial encounter: Secondary | ICD-10-CM | POA: Diagnosis present

## 2011-11-20 DIAGNOSIS — F17200 Nicotine dependence, unspecified, uncomplicated: Secondary | ICD-10-CM | POA: Diagnosis present

## 2011-11-20 DIAGNOSIS — I359 Nonrheumatic aortic valve disorder, unspecified: Secondary | ICD-10-CM | POA: Diagnosis present

## 2011-11-20 DIAGNOSIS — Z954 Presence of other heart-valve replacement: Secondary | ICD-10-CM | POA: Insufficient documentation

## 2011-11-20 DIAGNOSIS — I5022 Chronic systolic (congestive) heart failure: Secondary | ICD-10-CM | POA: Insufficient documentation

## 2011-11-20 DIAGNOSIS — F1011 Alcohol abuse, in remission: Secondary | ICD-10-CM | POA: Diagnosis present

## 2011-11-20 DIAGNOSIS — J4489 Other specified chronic obstructive pulmonary disease: Secondary | ICD-10-CM | POA: Diagnosis present

## 2011-11-20 DIAGNOSIS — Z79899 Other long term (current) drug therapy: Secondary | ICD-10-CM

## 2011-11-20 DIAGNOSIS — Z23 Encounter for immunization: Secondary | ICD-10-CM

## 2011-11-20 DIAGNOSIS — Z7982 Long term (current) use of aspirin: Secondary | ICD-10-CM

## 2011-11-20 DIAGNOSIS — I959 Hypotension, unspecified: Secondary | ICD-10-CM | POA: Diagnosis present

## 2011-11-20 DIAGNOSIS — S065X9A Traumatic subdural hemorrhage with loss of consciousness of unspecified duration, initial encounter: Principal | ICD-10-CM | POA: Diagnosis present

## 2011-11-20 DIAGNOSIS — I428 Other cardiomyopathies: Secondary | ICD-10-CM | POA: Diagnosis present

## 2011-11-20 DIAGNOSIS — I509 Heart failure, unspecified: Secondary | ICD-10-CM | POA: Diagnosis present

## 2011-11-20 HISTORY — PX: CRANIOTOMY: SHX93

## 2011-11-20 LAB — CBC
HCT: 32.7 % — ABNORMAL LOW (ref 39.0–52.0)
MCH: 32 pg (ref 26.0–34.0)
MCV: 95.1 fL (ref 78.0–100.0)
RDW: 12.9 % (ref 11.5–15.5)
WBC: 8.7 10*3/uL (ref 4.0–10.5)

## 2011-11-20 LAB — DIFFERENTIAL
Basophils Absolute: 0.1 10*3/uL (ref 0.0–0.1)
Eosinophils Relative: 2 % (ref 0–5)
Lymphocytes Relative: 19 % (ref 12–46)
Monocytes Absolute: 0.6 10*3/uL (ref 0.1–1.0)

## 2011-11-20 LAB — COMPREHENSIVE METABOLIC PANEL
AST: 21 U/L (ref 0–37)
CO2: 24 mEq/L (ref 19–32)
Calcium: 9.4 mg/dL (ref 8.4–10.5)
Creatinine, Ser: 0.84 mg/dL (ref 0.50–1.35)
GFR calc Af Amer: >90 mL/min (ref >90)
GFR calc non Af Amer: >90 mL/min (ref >90)
Glucose, Bld: 81 mg/dL (ref 70–99)

## 2011-11-20 LAB — BASIC METABOLIC PANEL
BUN: 11 mg/dL (ref 6–23)
CO2: 26 mEq/L (ref 19–32)
Calcium: 10.7 mg/dL — ABNORMAL HIGH (ref 8.4–10.5)
Creatinine, Ser: 0.84 mg/dL (ref 0.50–1.35)
Glucose, Bld: 101 mg/dL — ABNORMAL HIGH (ref 70–99)
Sodium: 139 mEq/L (ref 135–145)

## 2011-11-20 LAB — APTT: aPTT: 42 seconds — ABNORMAL HIGH (ref 24–37)

## 2011-11-20 LAB — PROTIME-INR: INR: 1.11 (ref 0.00–1.49)

## 2011-11-20 SURGERY — CRANIOTOMY HEMATOMA EVACUATION SUBDURAL
Anesthesia: General | Laterality: Left

## 2011-11-20 MED ORDER — NEOSTIGMINE METHYLSULFATE 1 MG/ML IJ SOLN
INTRAMUSCULAR | Status: DC | PRN
  Administered 2011-11-20: 5 mg via INTRAVENOUS

## 2011-11-20 MED ORDER — SUFENTANIL CITRATE 50 MCG/ML IV SOLN
INTRAVENOUS | Status: DC | PRN
  Administered 2011-11-20: 15 ug via INTRAVENOUS

## 2011-11-20 MED ORDER — SODIUM CHLORIDE 0.9 % IV BOLUS (SEPSIS)
1000.0000 mL | Freq: Once | INTRAVENOUS | Status: AC
  Administered 2011-11-20: 1000 mL via INTRAVENOUS

## 2011-11-20 MED ORDER — LIDOCAINE-EPINEPHRINE 1 %-1:100000 IJ SOLN
INTRAMUSCULAR | Status: DC | PRN
  Administered 2011-11-20: 5 mL

## 2011-11-20 MED ORDER — LABETALOL HCL 5 MG/ML IV SOLN: 10.0000 mg | INTRAVENOUS | Status: DC | PRN

## 2011-11-20 MED ORDER — PROPOFOL 10 MG/ML IV EMUL
INTRAVENOUS | Status: DC | PRN
  Administered 2011-11-20 (×2): 160 mg via INTRAVENOUS

## 2011-11-20 MED ORDER — SPIRONOLACTONE 12.5 MG HALF TABLET
12.5000 mg | ORAL_TABLET | Freq: Every day | ORAL | Status: DC
  Administered 2011-11-20 – 2011-11-23 (×3): 12.5 mg via ORAL
  Filled 2011-11-20 (×5): qty 1

## 2011-11-20 MED ORDER — MORPHINE SULFATE 2 MG/ML IJ SOLN
1.0000 mg | INTRAMUSCULAR | Status: DC | PRN
  Administered 2011-11-20 – 2011-11-22 (×16): 2 mg via INTRAVENOUS
  Filled 2011-11-20 (×15): qty 1

## 2011-11-20 MED ORDER — MULTI-VITAMIN/MINERALS PO TABS
1.0000 | ORAL_TABLET | Freq: Every day | ORAL | Status: DC
  Administered 2011-11-21 – 2011-11-23 (×3): 1 via ORAL
  Filled 2011-11-20 (×4): qty 1

## 2011-11-20 MED ORDER — GLYCOPYRROLATE 0.2 MG/ML IJ SOLN
INTRAMUSCULAR | Status: DC | PRN
  Administered 2011-11-20: .8 mg via INTRAVENOUS

## 2011-11-20 MED ORDER — SODIUM CHLORIDE 0.9 % IR SOLN
Status: DC | PRN
  Administered 2011-11-20: 17:00:00

## 2011-11-20 MED ORDER — ACETAMINOPHEN 650 MG RE SUPP: 650.0000 mg | RECTAL | Status: DC | PRN

## 2011-11-20 MED ORDER — ROCURONIUM BROMIDE 100 MG/10ML IV SOLN
INTRAVENOUS | Status: DC | PRN
  Administered 2011-11-20: 20 mg via INTRAVENOUS

## 2011-11-20 MED ORDER — MORPHINE SULFATE 2 MG/ML IJ SOLN
INTRAMUSCULAR | Status: AC
  Administered 2011-11-20: 2 mg via INTRAVENOUS
  Filled 2011-11-20: qty 1

## 2011-11-20 MED ORDER — LACTATED RINGERS IV SOLN
INTRAVENOUS | Status: DC | PRN
  Administered 2011-11-20: 17:00:00 via INTRAVENOUS

## 2011-11-20 MED ORDER — LIDOCAINE HCL (CARDIAC) 20 MG/ML IV SOLN
INTRAVENOUS | Status: DC | PRN
  Administered 2011-11-20: 60 mg via INTRAVENOUS

## 2011-11-20 MED ORDER — THROMBIN 20000 UNITS EX KIT
PACK | CUTANEOUS | Status: DC | PRN
  Administered 2011-11-20: 17:00:00 via TOPICAL

## 2011-11-20 MED ORDER — CARVEDILOL 6.25 MG PO TABS
6.2500 mg | ORAL_TABLET | Freq: Two times a day (BID) | ORAL | Status: DC
  Administered 2011-11-21 (×2): 6.25 mg via ORAL
  Filled 2011-11-20 (×6): qty 1

## 2011-11-20 MED ORDER — PHENYLEPHRINE HCL 10 MG/ML IJ SOLN
10.0000 mg | INTRAVENOUS | Status: DC | PRN
  Administered 2011-11-20: 10 ug/min via INTRAVENOUS

## 2011-11-20 MED ORDER — LIDOCAINE HCL 4 % MT SOLN
OROMUCOSAL | Status: DC | PRN
  Administered 2011-11-20: 4 mL via TOPICAL

## 2011-11-20 MED ORDER — ACETAMINOPHEN 325 MG PO TABS: 650.0000 mg | ORAL_TABLET | ORAL | Status: DC | PRN

## 2011-11-20 MED ORDER — VITAMIN B-12 250 MCG PO TABS
250.0000 ug | ORAL_TABLET | Freq: Every day | ORAL | Status: DC
  Administered 2011-11-21 – 2011-11-23 (×3): 250 ug via ORAL
  Filled 2011-11-20 (×4): qty 1

## 2011-11-20 MED ORDER — ASPIRIN 81 MG PO TABS
81.0000 mg | ORAL_TABLET | Freq: Every day | ORAL | Status: DC
  Administered 2011-11-21 – 2011-11-22 (×2): 81 mg via ORAL
  Filled 2011-11-20 (×3): qty 1

## 2011-11-20 MED ORDER — ONDANSETRON HCL 4 MG PO TABS: 4.0000 mg | ORAL_TABLET | ORAL | Status: DC | PRN

## 2011-11-20 MED ORDER — MICROFIBRILLAR COLL HEMOSTAT EX PADS
MEDICATED_PAD | CUTANEOUS | Status: DC | PRN
  Administered 2011-11-20: 1 via TOPICAL

## 2011-11-20 MED ORDER — ONDANSETRON HCL 4 MG/2ML IJ SOLN: 4.0000 mg | INTRAMUSCULAR | Status: DC | PRN

## 2011-11-20 MED ORDER — CEFAZOLIN SODIUM 1-5 GM-% IV SOLN
1.0000 g | Freq: Three times a day (TID) | INTRAVENOUS | Status: AC
  Administered 2011-11-20 – 2011-11-21 (×3): 1 g via INTRAVENOUS
  Filled 2011-11-20 (×3): qty 50

## 2011-11-20 MED ORDER — LISINOPRIL 5 MG PO TABS
5.0000 mg | ORAL_TABLET | Freq: Every day | ORAL | Status: DC
  Administered 2011-11-20 – 2011-11-23 (×4): 5 mg via ORAL
  Filled 2011-11-20 (×5): qty 1

## 2011-11-20 MED ORDER — HYDROCODONE-ACETAMINOPHEN 5-325 MG PO TABS
1.0000 | ORAL_TABLET | ORAL | Status: DC | PRN
  Administered 2011-11-20 – 2011-11-21 (×6): 1 via ORAL
  Filled 2011-11-20 (×7): qty 1

## 2011-11-20 MED ORDER — CEFAZOLIN SODIUM 1-5 GM-% IV SOLN
INTRAVENOUS | Status: DC | PRN
  Administered 2011-11-20: 2 g via INTRAVENOUS

## 2011-11-20 MED ORDER — BUPIVACAINE HCL (PF) 0.25 % IJ SOLN
INTRAMUSCULAR | Status: DC | PRN
  Administered 2011-11-20: 5 mL

## 2011-11-20 MED ORDER — CARVEDILOL 3.125 MG PO TABS
ORAL_TABLET | ORAL | Status: DC | PRN
  Administered 2011-11-20: 6.25 mg via ORAL

## 2011-11-20 MED ORDER — LISINOPRIL 5 MG PO TABS: 5.0000 mg | ORAL_TABLET | Freq: Every day | ORAL | Status: DC

## 2011-11-20 MED ORDER — 0.9 % SODIUM CHLORIDE (POUR BTL) OPTIME
TOPICAL | Status: DC | PRN
  Administered 2011-11-20: 1000 mL

## 2011-11-20 MED ORDER — ONDANSETRON HCL 4 MG/2ML IJ SOLN: 4.0000 mg | Freq: Once | INTRAMUSCULAR | Status: DC | PRN

## 2011-11-20 MED ORDER — POTASSIUM CHLORIDE IN NACL 20-0.9 MEQ/L-% IV SOLN
INTRAVENOUS | Status: DC
  Administered 2011-11-20 – 2011-11-21 (×2): via INTRAVENOUS
  Filled 2011-11-20 (×6): qty 1000

## 2011-11-20 MED ORDER — LISINOPRIL 5 MG PO TABS
5.0000 mg | ORAL_TABLET | Freq: Every day | ORAL | Status: DC
  Filled 2011-11-20 (×3): qty 1

## 2011-11-20 MED ORDER — DOCUSATE SODIUM 100 MG PO CAPS
100.0000 mg | ORAL_CAPSULE | Freq: Two times a day (BID) | ORAL | Status: DC
  Administered 2011-11-21 – 2011-11-23 (×6): 100 mg via ORAL
  Filled 2011-11-20 (×9): qty 1

## 2011-11-20 MED ORDER — CARVEDILOL 6.25 MG PO TABS: 6.2500 mg | ORAL_TABLET | Freq: Two times a day (BID) | ORAL | Status: DC

## 2011-11-20 MED ORDER — SODIUM CHLORIDE 0.9 % IV SOLN
500.0000 mg | Freq: Two times a day (BID) | INTRAVENOUS | Status: DC
  Administered 2011-11-21 – 2011-11-23 (×7): 500 mg via INTRAVENOUS
  Filled 2011-11-20 (×9): qty 5

## 2011-11-20 MED ORDER — HYDROMORPHONE HCL PF 1 MG/ML IJ SOLN: 0.2500 mg | INTRAMUSCULAR | Status: DC | PRN

## 2011-11-20 MED ORDER — PANTOPRAZOLE SODIUM 40 MG IV SOLR
40.0000 mg | Freq: Every day | INTRAVENOUS | Status: DC
  Administered 2011-11-20 – 2011-11-23 (×4): 40 mg via INTRAVENOUS
  Filled 2011-11-20 (×5): qty 40

## 2011-11-20 SURGICAL SUPPLY — 81 items
APL SKNCLS STERI-STRIP NONHPOA (GAUZE/BANDAGES/DRESSINGS)
BANDAGE GAUZE 4  KLING STR (GAUZE/BANDAGES/DRESSINGS) IMPLANT
BANDAGE GAUZE ELAST BULKY 4 IN (GAUZE/BANDAGES/DRESSINGS) IMPLANT
BENZOIN TINCTURE PRP APPL 2/3 (GAUZE/BANDAGES/DRESSINGS) IMPLANT
BIT DRILL WIRE PASS 1.3MM (BIT) IMPLANT
BRUSH SCRUB EZ 1% IODOPHOR (MISCELLANEOUS) ×2 IMPLANT
BRUSH SCRUB EZ PLAIN DRY (MISCELLANEOUS) ×2 IMPLANT
BUR ACORN 6.0 PRECISION (BURR) ×3 IMPLANT
BUR ROUTER D-58 CRANI (BURR) ×1 IMPLANT
CANISTER SUCTION 2500CC (MISCELLANEOUS) ×2 IMPLANT
CATH ROBINSON RED A/P 12FR (CATHETERS) IMPLANT
CLIP TI MEDIUM 6 (CLIP) ×1 IMPLANT
CLOTH BEACON ORANGE TIMEOUT ST (SAFETY) ×2 IMPLANT
CONT SPEC 4OZ CLIKSEAL STRL BL (MISCELLANEOUS) ×2 IMPLANT
CORDS BIPOLAR (ELECTRODE) ×2 IMPLANT
DRAIN JACKSON PRATT 10MM FLAT (MISCELLANEOUS) ×1 IMPLANT
DRAIN PENROSE 1/2X12 LTX STRL (WOUND CARE) IMPLANT
DRAIN SNY WOU 7FLT (WOUND CARE) IMPLANT
DRAPE NEUROLOGICAL W/INCISE (DRAPES) ×2 IMPLANT
DRAPE SURG IRRIG POUCH 19X23 (DRAPES) IMPLANT
DRAPE WARM FLUID 44X44 (DRAPE) ×2 IMPLANT
DRESSING TELFA 8X3 (GAUZE/BANDAGES/DRESSINGS) ×3 IMPLANT
DRILL WIRE PASS 1.3MM (BIT)
DRSG OPSITE 4X5.5 SM (GAUZE/BANDAGES/DRESSINGS) IMPLANT
DRSG OPSITE 6X11 MED (GAUZE/BANDAGES/DRESSINGS) ×1 IMPLANT
DRSG PAD ABDOMINAL 8X10 ST (GAUZE/BANDAGES/DRESSINGS) IMPLANT
DURAPREP 6ML APPLICATOR 50/CS (WOUND CARE) ×2 IMPLANT
ELECT CAUTERY BLADE 6.4 (BLADE) ×2 IMPLANT
ELECT REM PT RETURN 9FT ADLT (ELECTROSURGICAL) ×2
ELECTRODE REM PT RTRN 9FT ADLT (ELECTROSURGICAL) ×1 IMPLANT
EVACUATOR SILICONE 100CC (DRAIN) ×1 IMPLANT
GAUZE SPONGE 4X4 12PLY STRL LF (GAUZE/BANDAGES/DRESSINGS) ×1 IMPLANT
GAUZE SPONGE 4X4 16PLY XRAY LF (GAUZE/BANDAGES/DRESSINGS) IMPLANT
GLOVE BIO SURGEON STRL SZ 6.5 (GLOVE) ×2 IMPLANT
GLOVE BIO SURGEON STRL SZ8 (GLOVE) ×2 IMPLANT
GLOVE BIOGEL PI IND STRL 6.5 (GLOVE) IMPLANT
GLOVE BIOGEL PI IND STRL 8 (GLOVE) ×1 IMPLANT
GLOVE BIOGEL PI IND STRL 8.5 (GLOVE) ×1 IMPLANT
GLOVE BIOGEL PI INDICATOR 6.5 (GLOVE) ×1
GLOVE BIOGEL PI INDICATOR 8 (GLOVE)
GLOVE BIOGEL PI INDICATOR 8.5 (GLOVE) ×1
GLOVE ECLIPSE 7.5 STRL STRAW (GLOVE) ×1 IMPLANT
GLOVE EXAM NITRILE LRG STRL (GLOVE) IMPLANT
GLOVE EXAM NITRILE MD LF STRL (GLOVE) IMPLANT
GLOVE EXAM NITRILE XL STR (GLOVE) IMPLANT
GLOVE EXAM NITRILE XS STR PU (GLOVE) IMPLANT
GOWN BRE IMP SLV AUR LG STRL (GOWN DISPOSABLE) ×1 IMPLANT
GOWN BRE IMP SLV AUR XL STRL (GOWN DISPOSABLE) ×1 IMPLANT
GOWN STRL REIN 2XL LVL4 (GOWN DISPOSABLE) IMPLANT
HEMOSTAT SURGICEL 2X14 (HEMOSTASIS) ×2 IMPLANT
KIT BASIN OR (CUSTOM PROCEDURE TRAY) ×2 IMPLANT
KIT ROOM TURNOVER OR (KITS) ×2 IMPLANT
NDL HYPO 25X1 1.5 SAFETY (NEEDLE) ×1 IMPLANT
NEEDLE HYPO 25X1 1.5 SAFETY (NEEDLE) ×2 IMPLANT
NS IRRIG 1000ML POUR BTL (IV SOLUTION) ×2 IMPLANT
PACK CRANIOTOMY (CUSTOM PROCEDURE TRAY) ×2 IMPLANT
PAD ARMBOARD 7.5X6 YLW CONV (MISCELLANEOUS) ×2 IMPLANT
PATTIES SURGICAL .5 X.5 (GAUZE/BANDAGES/DRESSINGS) IMPLANT
PATTIES SURGICAL .5 X3 (DISPOSABLE) IMPLANT
PATTIES SURGICAL 1X1 (DISPOSABLE) IMPLANT
PIN MAYFIELD SKULL DISP (PIN) IMPLANT
PLATE 1.5  2HOLE LNG NEURO (Plate) ×3 IMPLANT
PLATE 1.5 2HOLE LNG NEURO (Plate) IMPLANT
SCREW SELF DRILL HT 1.5/4MM (Screw) ×6 IMPLANT
SPECIMEN JAR SMALL (MISCELLANEOUS) IMPLANT
SPONGE GAUZE 4X4 12PLY (GAUZE/BANDAGES/DRESSINGS) ×1 IMPLANT
SPONGE NEURO XRAY DETECT 1X3 (DISPOSABLE) IMPLANT
SPONGE SURGIFOAM ABS GEL 12-7 (HEMOSTASIS) IMPLANT
STAPLER SKIN PROX WIDE 3.9 (STAPLE) ×2 IMPLANT
SUT ETHILON 3 0 PS 1 (SUTURE) ×1 IMPLANT
SUT NURALON 4 0 TR CR/8 (SUTURE) ×4 IMPLANT
SUT VIC AB 2-0 CP2 18 (SUTURE) ×4 IMPLANT
SUT VIC AB 3-0 SH 8-18 (SUTURE) ×1 IMPLANT
SYR 20ML ECCENTRIC (SYRINGE) ×2 IMPLANT
SYR CONTROL 10ML LL (SYRINGE) ×1 IMPLANT
TOWEL OR 17X24 6PK STRL BLUE (TOWEL DISPOSABLE) ×2 IMPLANT
TOWEL OR 17X26 10 PK STRL BLUE (TOWEL DISPOSABLE) ×2 IMPLANT
TRAP SPECIMEN MUCOUS 40CC (MISCELLANEOUS) IMPLANT
TRAY FOLEY CATH 14FRSI W/METER (CATHETERS) IMPLANT
UNDERPAD 30X30 INCONTINENT (UNDERPADS AND DIAPERS) IMPLANT
WATER STERILE IRR 1000ML POUR (IV SOLUTION) ×2 IMPLANT

## 2011-11-20 NOTE — Progress Notes (Signed)
Encounter addended by: Noralee Space, RN on: 11/20/2011 11:23 AM<BR>     Documentation filed: Patient Instructions Section, Orders

## 2011-11-20 NOTE — Anesthesia Postprocedure Evaluation (Signed)
  Anesthesia Post-op Note  Patient: Benjamin [name]  Procedure(s) Performed:  CRANIOTOMY HEMATOMA EVACUATION SUBDURAL  Patient Location: PACU  Anesthesia Type: General  Level of Consciousness: awake, alert  and oriented  Airway and Oxygen Therapy: Patient Spontanous Breathing  Post-op Pain: mild  Post-op Assessment: Post-op Vital signs reviewed and Patient's Cardiovascular Status Stable  Post-op Vital Signs: stable  Complications: No apparent anesthesia complications

## 2011-11-20 NOTE — Assessment & Plan Note (Addendum)
Suspect this due to volume depletion and orthostasis. Will stop spiro and cut out morning lisinopril. Encouraged him to increase his fluid intake slightly. Will place 2 week event monitor to make sure he is not having VT. Call immediately if dizziness persists. Will get head CT due to persistent HAs.

## 2011-11-20 NOTE — ED Notes (Addendum)
Pt presents to department from radiology for abnormal head CT. Pt states he had syncopal episode x2 weeks ago, fell and struck head on floor. Also states persistent headache and dizziness. Pt is conscious alert and oriented x4 at the present. No signs of distress at the time. 5/10 headache at the time.

## 2011-11-20 NOTE — Progress Notes (Signed)
Patient's clothing and wallet removed by R. Earnest Conroy, Scientist, clinical (histocompatibility and immunogenetics). Left in PACU with Danella Sensing, RN. Security called to come pick up and place in safe before taking patient to [facility].
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
## 2011-11-20 NOTE — Op Note (Signed)
11/20/2011  7:00 PM  PATIENT:  Benjamin [name]  56 y.o. male  PRE-OPERATIVE DIAGNOSIS:  Left Subdural Hematoma  POST-OPERATIVE DIAGNOSIS:  Left Subdural Hematoma   PROCEDURE:  Procedure(s): CRANIOTOMY HEMATOMA EVACUATION SUBDURAL  SURGEON:  Surgeon(s): Dorian Heckle, MD  PHYSICIAN ASSISTANT:   ASSISTANTS: none   ANESTHESIA:   general  EBL:  Total I/O In: 600 [I.V.:600] Out: -   BLOOD ADMINISTERED:none  DRAINS: (10) Jackson-Pratt drain(s) with closed bulb suction in the subdural space   LOCAL MEDICATIONS USED:  LIDOCAINE 10CC  SPECIMEN:  No Specimen  DISPOSITION OF SPECIMEN:  N/A  COUNTS:  YES  TOURNIQUET:  * No tourniquets in log *  DICTATION: Patient was brought to the operating room and following the smooth and uncomplicated induction of general endotracheal anesthesia he was placed with a blanket roll under his left side right side on doughnut head holder. Left sided scalp was shaved then prepped and draped in usual sterile fashion. Area of incision was infiltrated with local lidocaine and a linear incision was made at the superior temporal line carried through the temporalis fascia to expose the paracranium Raney clips were applied a small trephine was made and a bone flap was elevated. The dura was incised with 15 blade and a significant amount of crank oil consistency subdural blood was then removed. There was coagulated blood and some sort subdural membranes which were also removed proximately 3 L of saline irrigation was utilized and the subdural space appeared be significantly cleared of blood. A #10 JP drain was inserted through a separate stab incision and anchored with a nylon stitch. The galea was then reapproximated with 2-0 Vicryl sutures the skin was reprepped with staples and a sterile occlusive dressing was placed. Patient was extubated in the operating room and taken to recovery in stable satisfactory condition operation well.   Counts were correct at the end  of the case.  PLAN OF CARE: Admit to inpatient   PATIENT DISPOSITION:  PACU - hemodynamically stable.   Delay start of Pharmacological VTE agent (>24hrs) due to surgical blood loss or risk of bleeding:  YES

## 2011-11-20 NOTE — ED Notes (Signed)
Dr. Ray at bedside to speak with patient.  

## 2011-11-20 NOTE — ED Notes (Signed)
Pt presents to department for evaluation of abnormal head CT, dizziness, headache and syncope. States he had syncopal episode x2 weeks ago where he fell and struck head. Mild bruising noted to L temporal region. Pt states persistent headache and dizziness. Rating 5/10 at the time. He is conscious alert and oriented x4. No neurological deficits noted. Able to move all extremities without difficulty. No signs of distress at the time.

## 2011-11-20 NOTE — Transfer of Care (Signed)
Immediate Anesthesia Transfer of Care Note  Patient: Benjamin [name]  Procedure(s) Performed:  CRANIOTOMY HEMATOMA EVACUATION SUBDURAL  Patient Location: PACU  Anesthesia Type: General  Level of Consciousness: awake, alert  and oriented  Airway & Oxygen Therapy: Patient Spontanous Breathing  Post-op Assessment: Report given to PACU RN, Post -op Vital signs reviewed and stable and Patient moving all extremities  Post vital signs: Reviewed and stable Filed Vitals:   11/20/11 1600  BP: 100/62  Pulse: 72  Temp: 37.1 C  Resp: 18    Complications: No apparent anesthesia complications

## 2011-11-20 NOTE — Telephone Encounter (Signed)
Patient does not need monitor per [facility]

## 2011-11-20 NOTE — H&P (Signed)
Please see my note in Emergency Provider section, which is my H & P.

## 2011-11-20 NOTE — Anesthesia Preprocedure Evaluation (Signed)
Anesthesia Evaluation  Patient identified by MRN, date of birth, ID band Patient awake    Reviewed: Allergy & Precautions, H&P , NPO status , Patient's Chart, lab work & pertinent test results  History of Anesthesia Complications (+) DIFFICULT AIRWAY  Airway Mallampati: I TM Distance: >3 FB Neck ROM: full    Dental  (+) Edentulous Upper and Edentulous Lower   Pulmonary shortness of breath, COPDformer smoker         Cardiovascular hypertension, +CHF + Valvular Problems/Murmurs regular Normal    Neuro/Psych Negative Neurological ROS  Negative Psych ROS   GI/Hepatic Neg liver ROS, GERD-  ,  Endo/Other  Negative Endocrine ROS  Renal/GU negative Renal ROS  Genitourinary negative   Musculoskeletal   Abdominal   Peds  Hematology negative hematology ROS (+) Blood dyscrasia, ,   Anesthesia Other Findings   Reproductive/Obstetrics                           Anesthesia Physical Anesthesia Plan  ASA: III  Anesthesia Plan: General ETT   Post-op Pain Management:    Induction: Intravenous  Airway Management Planned: Oral ETT  Additional Equipment: Arterial line  Intra-op Plan:   Post-operative Plan: Extubation in OR  Informed Consent: I have reviewed the patients History and Physical, chart, labs and discussed the procedure including the risks, benefits and alternatives for the proposed anesthesia with the patient or authorized representative who has indicated his/her understanding and acceptance.     Plan Discussed with: Anesthesiologist, CRNA and Surgeon  Anesthesia Plan Comments:         Anesthesia Quick Evaluation

## 2011-11-20 NOTE — ED Notes (Signed)
Pt transported to neuro OR. Vital signs stable. Report given to CRNA. Surgeon to discuss procedure and obtain consent with patient.

## 2011-11-20 NOTE — Assessment & Plan Note (Signed)
Stable s/p AVR.  

## 2011-11-20 NOTE — Progress Notes (Signed)
HPI:   Benjamin [name] is 56 year old male with h/o severe CHF due to NICM (thought due to ETOH) and severe aortic stenosis in the setting of a bicuspid aortic valve. He was admitted to [facility] in January with acute on chronic systolic heart failure complicated by cardiogenic shock. He was eventually transferred to [facility] and underwent bioprosthetic AoV replacement. Peri-op course was complicated by a VF arrest prior to going to the OR.  F/u echo in March 2012, showed a well-functioning valve, however EF remains depressed at 20%.   He saw Dr. Graciela Husbands for ICD evaluation but decided not to implant due to NYHA I fx capacity.   Overall doing very well. Walking several miles every day without much problem. No orthopnea, edema or PND. No longer requiring lasix. Taking 12.5 spiro daily.   Continues to have occasionally dizziness when standing. On Dec 31st. Got up to go to bathroom and had syncopal episode. Hit head. Still having HAs.  No palpitations or CP.  Has not resumed any alcohol intake since September.    ROS: All systems negative except as listed in HPI, PMH and Problem List.  Past Medical History  Diagnosis Date  . COPD (chronic obstructive pulmonary disease)   . Biventricular CHF (congestive heart failure)     LVEF 20%  . 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 Aortic insufficiency     Moderate  . Aortic stenosis     Severe, bicuspid aortic valve. s/p bioprosthetic  AVR 1/12  . Alcohol abuse, in remission     Stopped in 9/11    Current Outpatient Prescriptions  Medication Sig Dispense Refill  . Acetaminophen-Caffeine (TENSION HEADACHE) 500-65 MG TABS Take 2 tablets by mouth every 6 (six) hours as needed.      Marland Kitchen aspirin 81 MG tablet Take 81 mg by mouth daily.        . carvedilol (COREG) 6.25 MG tablet Take 1 tablet (6.25 mg total) by mouth 2 (two) times daily with a meal.  60 tablet  6  . lisinopril (PRINIVIL,ZESTRIL) 5 MG tablet TAKE ONE TABLET BY MOUTH  TWICE DAILY  60 tablet  6  . Multiple Vitamins-Minerals (MULTIVITAMIN WITH MINERALS) tablet Take 1 tablet by mouth daily.        Marland Kitchen spironolactone (ALDACTONE) 12.5 mg TABS Take 0.5 tablets (12.5 mg total) by mouth daily.  30 tablet  3  . vitamin B-12 (CYANOCOBALAMIN) 250 MCG tablet Take 250 mcg by mouth daily.           PHYSICAL EXAM: Filed Vitals:   11/20/11 1036  BP: 78/48  Pulse: 88   General:  Thin  Well appearing. No resp difficulty HEENT: normal except for poor dentition. And bump on L forehead Neck: supple. JVP flat. Carotids 2+ bilaterally; no bruits. No lymphadenopathy or thryomegaly appreciated. Cor: PMI normal. Regular rate & rhythm. No rubs, gallops or murmurs.  Well healed CABG scar. Lungs: clear Abdomen: soft, nontender, nondistended. No hepatosplenomegaly. No bruits or masses. Good bowel sounds. Extremities: no cyanosis, clubbing, rash, edema Neuro: alert & orientedx3, cranial nerves grossly intact. Moves all 4 extremities w/o difficulty. Affect pleasant.    ECG: NSR 60 LVH with repol    ASSESSMENT & PLAN:

## 2011-11-20 NOTE — Patient Instructions (Addendum)
Labs today  Stop Spironolactone Stop the AM dose of Lisinopril, continue the PM dose  Non-Cardiac CT scanning, (CAT scanning), is a noninvasive, special x-ray that produces cross-sectional images of the body using x-rays and a computer. CT scans help physicians diagnose and treat medical conditions. For some CT exams, a contrast material is used to enhance visibility in the area of the body being studied. CT scans provide greater clarity and reveal more details than regular x-ray exams.  Your physician has recommended that you wear an event monitor. Event monitors are medical devices that record the heart's electrical activity. Doctors most often [place] these monitors to diagnose arrhythmias. Arrhythmias are problems with the speed or rhythm of the heartbeat. The monitor is a small, portable device. You can wear one while you do your normal daily activities. This is usually used to diagnose what is causing palpitations/syncope (passing out).  Your physician has requested that you have an echocardiogram. Echocardiography is a painless test that uses sound waves to create images of your heart. It provides your doctor with information about the size and shape of your heart and how well your heart's chambers and valves are working. This procedure takes approximately one hour. There are no restrictions for this procedure.  Your physician recommends that you schedule a follow-up appointment in: 1 month

## 2011-11-20 NOTE — Assessment & Plan Note (Signed)
BP low but otherwise doing fairly well. Will check repeat u/s. Medication dosing limited by hypotension. Place monitor to reconsider need for ICD. Can consider digoxin as needed.
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
## 2011-11-21 MED ORDER — INFLUENZA VIRUS VACC SPLIT PF IM SUSP
0.5000 mL | INTRAMUSCULAR | Status: AC
  Administered 2011-11-22: 0.5 mL via INTRAMUSCULAR
  Filled 2011-11-21: qty 0.5

## 2011-11-21 MED ORDER — CHLORHEXIDINE GLUCONATE 0.12 % MT SOLN
OROMUCOSAL | Status: AC
  Filled 2011-11-21: qty 15

## 2011-11-21 MED FILL — Sodium Chloride Irrigation Soln 0.9%: Qty: 500 | Status: AC

## 2011-11-21 NOTE — Progress Notes (Signed)
Subjective: Patient reports doing well.  Objective: Vital signs in last 24 hours: Temp:  [98.2 F (36.8 C)-99.3 F (37.4 C)] 98.6 F (37 C) (01/16 0344) Pulse Rate:  [55-88] 60  (01/16 0700) Resp:  [9-18] 10  (01/16 0700) BP: (78-127)/(48-73) 102/60 mmHg (01/16 0700) SpO2:  [96 %-100 %] 97 % (01/16 0700) Weight:  [56.2 kg (123 lb 14.4 oz)-58.968 kg (130 lb)] 57.1 kg (125 lb 14.1 oz) (01/16 0630)  Intake/Output from previous day: 01/15 0701 - 01/16 0700 In: 1435 [I.V.:1230; IV Piggyback:205] Out: 73 [Drains:73] Intake/Output this shift:    Physical Exam: No drift.  Full strength.  Drainage from dressing.  Lab Results:  [facility] 11/20/11 1357  WBC 8.7  HGB 11.0*  HCT 32.7*  PLT 369   BMET  Basename 11/20/11 1357 11/20/11 1130  NA 139 139  K 4.0 5.5*  CL 104 101  CO2 24 26  GLUCOSE 81 101*  BUN 11 11  CREATININE 0.84 0.84  CALCIUM 9.4 10.7*    Studies/Results: Ct Head Wo Contrast  11/20/2011  *RADIOLOGY REPORT*  Clinical Data: 56 year old male with syncope, left head injury. Headache.  CT HEAD WITHOUT CONTRAST  Technique:  Contiguous axial images were obtained from the base of the skull through the vertex without contrast.  Comparison: None.  Findings: Visualized paranasal sinuses and mastoids are clear. Visualized orbit soft tissues are within normal limits.  Small left forehead scalp hematoma.  Underlying frontal bone intact.  No other acute scalp soft tissue findings.  No acute osseous abnormality identified.  Left side mixed density subdural hematoma measures up to 9 mm in thickness and is associated with 10 mm of rightward midline shift. The broad-based and seen throughout the hemisphere.  There is a small volume of the blood along the tentorium.  Gray-white matter differentiation is preserved. No evidence of cortically based acute infarction identified.  Mass effect on the left lateral ventricle, no ventriculomegaly.  Basilar cisterns remain patent. No suspicious  intracranial vascular hyperdensity.  IMPRESSION: 1.  Left subdural hematoma.  10 mm of rightward midline shift. 2.  Small left forehead scalp hematoma.  No skull fracture identified.  Critical Value/emergent results were called by telephone at the time of interpretation on 11/20/2011  at 1229 hours  to  Dr. Nicholes Mango, who verbally acknowledged these results. The patient is being transferred from the radiology outpatient department to the emergency department for continued care.  Original Report Authenticated By: Harley Hallmark, M.D.   Dg Chest Port 1 View  11/20/2011  *RADIOLOGY REPORT*  Clinical Data: Chest pain  PORTABLE CHEST - 1 VIEW  Comparison: 04/12/2011  Findings: Cardiac leads obscure detail.  Evidence of prior median sternotomy noted.  Heart size is normal.  The lungs are grossly clear.  No pleural effusion.  Left axillary clips are present. No acute osseous abnormality.  IMPRESSION: No acute cardiopulmonary process.  Original Report Authenticated By: Harrel Lemon, M.D.    Assessment/Plan: 45cc bloody drainage from JP last night.  Continue JP drain, reinforce dressing.  Advance diet.    LOS: 1 day    Dorian Heckle, MD 11/21/2011, 7:24 AM

## 2011-11-22 ENCOUNTER — Encounter (HOSPITAL_COMMUNITY): Payer: Self-pay | Admitting: Neurosurgery

## 2011-11-22 DIAGNOSIS — S065X9A Traumatic subdural hemorrhage with loss of consciousness of unspecified duration, initial encounter: Secondary | ICD-10-CM

## 2011-11-22 DIAGNOSIS — I359 Nonrheumatic aortic valve disorder, unspecified: Secondary | ICD-10-CM

## 2011-11-22 DIAGNOSIS — I959 Hypotension, unspecified: Secondary | ICD-10-CM

## 2011-11-22 DIAGNOSIS — I5022 Chronic systolic (congestive) heart failure: Secondary | ICD-10-CM

## 2011-11-22 MED ORDER — OXYCODONE-ACETAMINOPHEN 5-325 MG PO TABS
1.0000 | ORAL_TABLET | ORAL | Status: DC | PRN
  Administered 2011-11-22 – 2011-11-23 (×10): 2 via ORAL
  Administered 2011-11-24: 1 via ORAL
  Administered 2011-11-24: 2 via ORAL
  Filled 2011-11-22 (×12): qty 2

## 2011-11-22 MED ORDER — PROMETHAZINE HCL 25 MG/ML IJ SOLN: 12.5000 mg | INTRAMUSCULAR | Status: DC | PRN

## 2011-11-22 MED ORDER — CARVEDILOL 3.125 MG PO TABS
3.1250 mg | ORAL_TABLET | Freq: Two times a day (BID) | ORAL | Status: DC
  Administered 2011-11-22 – 2011-11-24 (×3): 3.125 mg via ORAL
  Filled 2011-11-22 (×6): qty 1

## 2011-11-22 MED ORDER — HYDROCODONE-ACETAMINOPHEN 5-325 MG PO TABS
1.0000 | ORAL_TABLET | ORAL | Status: DC | PRN
  Filled 2011-11-22: qty 2

## 2011-11-22 NOTE — Progress Notes (Signed)
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 He was admitted to [facility] in January with acute on chronic systolic heart failure complicated by cardiogenic shock. He was eventually transferred to [facility] and underwent bioprosthetic AoV replacement. Peri-op course was complicated by a VF arrest prior to going to the OR. F/u echo in March 2012, showed a well-functioning valve, however EF remains depressed at 20%.   Admitted from clinic on 1/15 due to large SHD after fall on 12/31.  Now s/p evacuation of SDH. Doing much better. Denies HA. Drain still in place. No dyspnea, CP, SOB or orthopnea. BP soft but stable.     Intake/Output Summary (Last 24 hours) at 11/22/11 0923 Last data filed at 11/22/11 0900  Gross per 24 hour  Intake 1492.5 ml  Output   2530 ml  Net -1037.5 ml    Current meds:    . aspirin  81 mg Oral Daily  . carvedilol  6.25 mg Oral BID WC  .  ceFAZolin (ANCEF) IV  1 g Intravenous Q8H  . chlorhexidine      . docusate sodium  100 mg Oral BID  . influenza  inactive virus vaccine  0.5 mL Intramuscular Tomorrow-1000  . levetiracetam  500 mg Intravenous Q12H  . lisinopril  5 mg Oral Daily  . lisinopril  5 mg Oral QHS  . multivitamin with minerals  1 tablet Oral Daily  . pantoprazole (PROTONIX) IV  40 mg Intravenous QHS  . spironolactone  12.5 mg Oral Daily  . vitamin B-12  250 mcg Oral Daily   Infusions:    . 0.9 % NaCl with KCl 20 mEq / L 75 mL/hr at 11/22/11 0600     Objective:  Blood pressure 102/68, pulse 79, temperature 98.5 F (36.9 C), temperature source Oral, resp. rate 13, height 5\' 11"  (1.803 m), weight 57.1 kg (125 lb 14.1 oz), SpO2 98.00%. Weight change:   Physical Exam: General: Thin Well appearing. No resp difficulty  HEENT: poor dentition. Surgical wound with JP drain of L. Neck: supple. JVP 9 Carotids 2+  bilaterally; no bruits. No lymphadenopathy or thryomegaly appreciated.  Cor: PMI normal. Regular rate & rhythm. No rubs, gallops or murmurs. Well healed CABG scar.  Lungs: clear  Abdomen: soft, nontender, nondistended. No hepatosplenomegaly. No bruits or masses. Good bowel sounds.  Extremities: no cyanosis, clubbing, rash, edema  Neuro: alert & orientedx3, cranial nerves grossly intact. Moves all 4 extremities w/o difficulty. Affect pleasant.     Lab Results: Basic Metabolic Panel:  Lab 11/20/11 2130 11/20/11 1130  NA 139 139  K 4.0 5.5*  CL 104 101  CO2 24 26  GLUCOSE 81 101*  BUN 11 11  CREATININE 0.84 0.84  CALCIUM 9.4 10.7*  MG -- --  PHOS -- --   Liver Function Tests:  Lab 11/20/11 1357  AST 21  ALT 18  ALKPHOS 73  BILITOT 0.3  PROT 7.0  ALBUMIN 3.6   No results found for this basename: LIPASE:5,AMYLASE:5 in the last 168 hours No results found for this basename: AMMONIA:5 in the last 168 hours CBC:  Lab 11/20/11 1357  WBC 8.7  NEUTROABS 6.2  HGB 11.0*  HCT 32.7*  MCV 95.1  PLT 369   Cardiac Enzymes: No results found for this basename: CKTOTAL:5,CKMB:5,CKMBINDEX:5,TROPONINI:5 in the last 168 hours BNP: No components found with this basename:  POCBNP:5 CBG: No results found for this basename: GLUCAP:5 in the last 168 hours Microbiology: Lab Results  Component Value Date   CULT NO GROWTH 5 DAYS 11/15/2010   CULT NO GROWTH 5 DAYS 11/15/2010   No results found for this basename: CULT:2,SDES:2 in the last 168 hours  Imaging: Ct Head Wo Contrast  11/20/2011  *RADIOLOGY REPORT*  Clinical Data: 56 year old male with syncope, left head injury. Headache.  CT HEAD WITHOUT CONTRAST  Technique:  Contiguous axial images were obtained from the base of the skull through the vertex without contrast.  Comparison: None.  Findings: Visualized paranasal sinuses and mastoids are clear. Visualized orbit soft tissues are within normal limits.  Small left forehead scalp  hematoma.  Underlying frontal bone intact.  No other acute scalp soft tissue findings.  No acute osseous abnormality identified.  Left side mixed density subdural hematoma measures up to 9 mm in thickness and is associated with 10 mm of rightward midline shift. The broad-based and seen throughout the hemisphere.  There is a small volume of the blood along the tentorium.  Gray-white matter differentiation is preserved. No evidence of cortically based acute infarction identified.  Mass effect on the left lateral ventricle, no ventriculomegaly.  Basilar cisterns remain patent. No suspicious intracranial vascular hyperdensity.  IMPRESSION: 1.  Left subdural hematoma.  10 mm of rightward midline shift. 2.  Small left forehead scalp hematoma.  No skull fracture identified.  Critical Value/emergent results were called by telephone at the time of interpretation on 11/20/2011  at 1229 hours  to  Dr. Nicholes Mango, who verbally acknowledged these results. The patient is being transferred from the radiology outpatient department to the emergency department for continued care.  Original Report Authenticated By: Harley Hallmark, M.D.   Dg Chest Port 1 View  11/20/2011  *RADIOLOGY REPORT*  Clinical Data: Chest pain  PORTABLE CHEST - 1 VIEW  Comparison: 04/12/2011  Findings: Cardiac leads obscure detail.  Evidence of prior median sternotomy noted.  Heart size is normal.  The lungs are grossly clear.  No pleural effusion.  Left axillary clips are present. No acute osseous abnormality.  IMPRESSION: No acute cardiopulmonary process.  Original Report Authenticated By: Harrel Lemon, M.D.     ASSESSMENT:  1. SDH s/p evacuation 2. Fall 3. Chronic systolic HF EF 20% 4. Aortic stenosis s/p bioprosthetic AVR 5. Hypotension  PLAN/DISCUSSION:   Improving. BP soft so will cut back on HF meds a bit. Volume status ok for now. Will watch closely.  Appreciate Dr. Fredrich [name] care.  Benjamin Boom Bensimhon,MD 9:27 AM     LOS: 2  days    Arvilla Meres, MD 11/22/2011, 9:23 AM

## 2011-11-22 NOTE — Progress Notes (Signed)
Utilization review completed. Kentrel Clevenger, RN, BSN. 11/22/11  

## 2011-11-22 NOTE — Progress Notes (Signed)
Subjective: Patient reports doing well  Objective: Vital signs in last 24 hours: Temp:  [97.6 F (36.4 C)-98.5 F (36.9 C)] 98.5 F (36.9 C) (01/16 1955) Pulse Rate:  [58-127] 70  (01/17 0600) Resp:  [9-20] 10  (01/17 0600) BP: (82-114)/(49-77) 103/63 mmHg (01/17 0600) SpO2:  [96 %-100 %] 98 % (01/17 0600)  Intake/Output from previous day: 01/16 0701 - 01/17 0700 In: 1417.5 [P.O.:480; I.V.:787.5; IV Piggyback:150] Out: 2515 [Urine:2500; Drains:15] Intake/Output this shift: Total I/O In: 240 [P.O.:240] Out: 1500 [Urine:1500]  Neurologic: Alert and oriented X 3, normal strength and tone. Normal symmetric reflexes. Normal coordination and gait Dressing CDI Lab Results:  Basename 11/20/11 1357  WBC 8.7  HGB 11.0*  HCT 32.7*  PLT 369   BMET  Basename 11/20/11 1357 11/20/11 1130  NA 139 139  K 4.0 5.5*  CL 104 101  CO2 24 26  GLUCOSE 81 101*  BUN 11 11  CREATININE 0.84 0.84  CALCIUM 9.4 10.7*    Studies/Results: Ct Head Wo Contrast  11/20/2011  *RADIOLOGY REPORT*  Clinical Data: 56 year old male with syncope, left head injury. Headache.  CT HEAD WITHOUT CONTRAST  Technique:  Contiguous axial images were obtained from the base of the skull through the vertex without contrast.  Comparison: None.  Findings: Visualized paranasal sinuses and mastoids are clear. Visualized orbit soft tissues are within normal limits.  Small left forehead scalp hematoma.  Underlying frontal bone intact.  No other acute scalp soft tissue findings.  No acute osseous abnormality identified.  Left side mixed density subdural hematoma measures up to 9 mm in thickness and is associated with 10 mm of rightward midline shift. The broad-based and seen throughout the hemisphere.  There is a small volume of the blood along the tentorium.  Gray-white matter differentiation is preserved. No evidence of cortically based acute infarction identified.  Mass effect on the left lateral ventricle, no ventriculomegaly.   Basilar cisterns remain patent. No suspicious intracranial vascular hyperdensity.  IMPRESSION: 1.  Left subdural hematoma.  10 mm of rightward midline shift. 2.  Small left forehead scalp hematoma.  No skull fracture identified.  Critical Value/emergent results were called by telephone at the time of interpretation on 11/20/2011  at 1229 hours  to  Dr. Nicholes Mango, who verbally acknowledged these results. The patient is being transferred from the radiology outpatient department to the emergency department for continued care.  Original Report Authenticated By: Harley Hallmark, M.D.   Dg Chest Port 1 View  11/20/2011  *RADIOLOGY REPORT*  Clinical Data: Chest pain  PORTABLE CHEST - 1 VIEW  Comparison: 04/12/2011  Findings: Cardiac leads obscure detail.  Evidence of prior median sternotomy noted.  Heart size is normal.  The lungs are grossly clear.  No pleural effusion.  Left axillary clips are present. No acute osseous abnormality.  IMPRESSION: No acute cardiopulmonary process.  Original Report Authenticated By: Harrel Lemon, M.D.    Assessment/Plan: Doing well following craniotomy for subdural hematoma.  Bloody drainage persists, but diminishing.  Will check head CT in am and if doing well, D/C drain.  LOS: 2 days     Benjamin [name] D 11/22/2011, 6:41 AM

## 2011-11-23 ENCOUNTER — Inpatient Hospital Stay (HOSPITAL_COMMUNITY): Payer: Medicaid Other

## 2011-11-23 MED ORDER — ASPIRIN 81 MG PO CHEW
81.0000 mg | CHEWABLE_TABLET | Freq: Every day | ORAL | Status: DC
  Administered 2011-11-23: 81 mg via ORAL

## 2011-11-23 NOTE — Progress Notes (Signed)
Subjective: Patient reports (I feel good. Do you think I can go home today?"  Objective: Vital signs in last 24 hours: Temp:  [97.8 F (36.6 C)-98.7 F (37.1 C)] 97.8 F (36.6 C) (01/18 0700) Pulse Rate:  [53-79] 70  (01/18 0600) Resp:  [9-16] 11  (01/18 0600) BP: (89-118)/(58-86) 98/75 mmHg (01/18 0600) SpO2:  [96 %-100 %] 97 % (01/18 0600)  Intake/Output from previous day: 01/17 0701 - 01/18 0700 In: 1185 [P.O.:360; I.V.:825] Out: 1265 [Urine:1250; Drains:15] Intake/Output this shift:    Awakens to voice. Conversant. No drift. PEARLA. Tongue protrudes midline. Drain with <5cc; also minimal last 24hrs.  Lab Results:  [facility] 11/20/11 1357  WBC 8.7  HGB 11.0*  HCT 32.7*  PLT 369   BMET  Basename 11/20/11 1357 11/20/11 1130  NA 139 139  K 4.0 5.5*  CL 104 101  CO2 24 26  GLUCOSE 81 101*  BUN 11 11  CREATININE 0.84 0.84  CALCIUM 9.4 10.7*    Studies/Results: Ct Head Wo Contrast  11/23/2011  *RADIOLOGY REPORT*  Clinical Data: Followup subdural hematoma.  CT HEAD WITHOUT CONTRAST  Technique:  Contiguous axial images were obtained from the base of the skull through the vertex without contrast.  Comparison: 11/20/2011.  Findings: Left frontal craniotomy with drainage of left-sided subdural hematoma and placement of drainage catheter.  Residual broad-based left subdural collection (now with small amount of pneumocephalus) has maximal thickness of 6.8 mm versus prior 8.8 mm.  Mass effect upon the left lateral ventricle with midline shift to the right by 8.1 mm versus prior 9.5 mm.  Small tentorial subdural hematoma and small left posterior falx subdural hematoma remains.  Question tiny amount of subarachnoid blood?  No CT evidence of large acute infarct.  Small acute infarct cannot be excluded by CT.  No intracranial mass lesion detected on this unenhanced exam.  IMPRESSION: Drainage left-sided subdural hematoma with slight decrease in the size of the left sided subdural  collection and slight decrease in right-sided midline shift.  Please see above.  Original Report Authenticated By: Fuller Canada, M.D.    Assessment/Plan: Drain pulled on Dr. Fredrich Birks order. No bleeding from site. DSD placed. Pt tolerated all well.  LOS: 3 days  Continue supportive care.   Georgiann Cocker 11/23/2011, 8:02 AM

## 2011-11-23 NOTE — Progress Notes (Signed)
Subjective: Patient reports doing well Objective: Vital signs in last 24 hours: Temp:  [98.5 F (36.9 C)-98.7 F (37.1 C)] 98.7 F (37.1 C) (01/17 1934) Pulse Rate:  [53-79] 56  (01/18 0500) Resp:  [9-17] 9  (01/18 0500) BP: (89-118)/(58-86) 114/66 mmHg (01/18 0500) SpO2:  [94 %-100 %] 97 % (01/18 0500)  Intake/Output from previous day: 01/17 0701 - 01/18 0700 In: 1185 [P.O.:360; I.V.:825] Out: 1165 [Urine:1150; Drains:15] Intake/Output this shift: Total I/O In: -  Out: 550 [Urine:550]  Physical Exam: No drift.  No HA.  Doing well.  No weakness.  Lab Results:  [facility] 11/20/11 1357  WBC 8.7  HGB 11.0*  HCT 32.7*  PLT 369   BMET  Basename 11/20/11 1357 11/20/11 1130  NA 139 139  K 4.0 5.5*  CL 104 101  CO2 24 26  GLUCOSE 81 101*  BUN 11 11  CREATININE 0.84 0.84  CALCIUM 9.4 10.7*    Studies/Results: No results found.  Assessment/Plan: Minimal drainage from JP, discontinued.  Mobilize today and if doing well, consider discharge home in am.  I want to keep him through today as he lives alone and will have no one with him at home.    LOS: 3 days    Dorian Heckle, MD 11/23/2011, 6:22 AM

## 2011-11-24 MED ORDER — OXYCODONE-ACETAMINOPHEN 5-325 MG PO TABS: 1.0000 | ORAL_TABLET | ORAL | Status: AC | PRN

## 2011-11-24 MED ORDER — LEVETIRACETAM 500 MG PO TABS: 500.0000 mg | ORAL_TABLET | Freq: Two times a day (BID) | ORAL | Status: DC

## 2011-11-24 NOTE — Discharge Summary (Signed)
Physician Discharge Summary  Patient ID: Benjamin [name] MRN: [id number] DOB/AGE: [birth year] 56 y.o.  Admit date: 11/20/2011 Discharge date: 11/24/2011  Admission Diagnoses:Left Subdural Hematoma   Discharge Diagnoses: Left Subdural Hematoma  Active Problems:  SDH (subdural hematoma)  Hypotension   Discharged Condition: good  Hospital Course: Pt had HA and went to MD - ct showed SDH and pt presented to ER - admitted by DR Venetia Maxon and taken to OR.  Had CRANIOTOMY HEMATOMA EVACUATION SUBDURAL.  pty has done well, drain was d/c'ed 1/18 - pt increased his activity - and ready for d/c  Consults: none  Significant Diagnostic Studies: none  Treatments: surgery: PROCEDURE: Procedure(s):  CRANIOTOMY HEMATOMA EVACUATION SUBDURAL   Discharge Exam: Blood pressure 96/70, pulse 69, temperature 98.2 F (36.8 C), temperature source Axillary, resp. rate 13, height 5\' 11"  (1.803 m), weight 57.1 kg (125 lb 14.1 oz), SpO2 93.00%. No drift.  No HA.  Doing well.  No weakness., incision- C/D/i  Disposition: home  Discharge Orders    Future Appointments: Provider: Department: Dept Phone: Center:   12/13/2011 11:00 AM [facility]  None   12/13/2011 12:00 PM [facility] [phone] None   12/14/2011 8:20 AM Jonelle Sidle, MD [facility] [phone] [id number]     Current Discharge Medication List    START taking these medications   Details  levETIRAcetam (KEPPRA) 500 MG tablet Take 1 tablet (500 mg total) by mouth every 12 (twelve) hours. Qty: 60 tablet, Refills: 3    oxyCODONE-acetaminophen (PERCOCET) 5-325 MG per tablet Take 1-2 tablets by mouth every 4 (four) hours as needed for pain. Qty: 41 tablet, Refills: 0      CONTINUE these medications which have NOT CHANGED   Details  Acetaminophen-Caffeine (TENSION HEADACHE) 500-65 MG TABS Take 2 tablets by mouth every 6 (six) hours as needed. Tension headache    carvedilol (COREG) 6.25 MG  tablet Take 6.25 mg by mouth 2 (two) times daily with a meal.    lisinopril (PRINIVIL,ZESTRIL) 5 MG tablet Take 5 mg by mouth daily.    Multiple Vitamins-Minerals (MULTIVITAMIN WITH MINERALS) tablet Take 1 tablet by mouth daily.      spironolactone (ALDACTONE) 25 MG tablet Take 12.5 mg by mouth daily.    vitamin B-12 (CYANOCOBALAMIN) 250 MCG tablet Take 250 mcg by mouth daily.        STOP taking these medications     aspirin 81 MG tablet        Follow-up Information    Follow up with [facility] Department .     F/u dr Venetia Maxon in 1 week    Signed: Clydene Fake, MD 11/24/2011, 8:19 AM

## 2011-11-24 NOTE — Plan of Care (Signed)
Problem: Consults Goal: Diagnosis - Craniotomy Outcome: Completed/Met Date Met:  11/24/11 Subdural Hematomoa  Problem: Phase III Progression Outcomes Goal: Activity at appropriate level-compared to baseline (UP IN CHAIR FOR HEMODIALYSIS)  Outcome: Adequate for Discharge Ambulated several times around the unit.  Able to dress self.

## 2011-11-24 NOTE — Progress Notes (Signed)
    Patient doing well. Going home today.   Please stop spironolactone on discharge. We will f/u with him this week in HF clinic.  Thanks for superb care.   [name],MD 9:20 AM

## 2011-12-06 ENCOUNTER — Ambulatory Visit (HOSPITAL_COMMUNITY): Payer: Medicaid Other | Attending: Internal Medicine

## 2011-12-13 ENCOUNTER — Ambulatory Visit (HOSPITAL_COMMUNITY)
Admission: RE | Admit: 2011-12-13 | Discharge: 2011-12-13 | Disposition: A | Discharge status: Home / Self Care | Payer: Medicaid Other | Source: Ambulatory Visit | Attending: Internal Medicine | Admitting: Internal Medicine

## 2011-12-13 VITALS — BP 104/62 | HR 73 | Wt 126.8 lb

## 2011-12-13 DIAGNOSIS — R0609 Other forms of dyspnea: Secondary | ICD-10-CM | POA: Insufficient documentation

## 2011-12-13 DIAGNOSIS — R0989 Other specified symptoms and signs involving the circulatory and respiratory systems: Secondary | ICD-10-CM | POA: Insufficient documentation

## 2011-12-13 DIAGNOSIS — J449 Chronic obstructive pulmonary disease, unspecified: Secondary | ICD-10-CM | POA: Insufficient documentation

## 2011-12-13 DIAGNOSIS — Z954 Presence of other heart-valve replacement: Secondary | ICD-10-CM | POA: Insufficient documentation

## 2011-12-13 DIAGNOSIS — Z87891 Personal history of nicotine dependence: Secondary | ICD-10-CM | POA: Insufficient documentation

## 2011-12-13 DIAGNOSIS — F1010 Alcohol abuse, uncomplicated: Secondary | ICD-10-CM | POA: Insufficient documentation

## 2011-12-13 DIAGNOSIS — I509 Heart failure, unspecified: Secondary | ICD-10-CM | POA: Insufficient documentation

## 2011-12-13 DIAGNOSIS — I369 Nonrheumatic tricuspid valve disorder, unspecified: Secondary | ICD-10-CM

## 2011-12-13 DIAGNOSIS — I5022 Chronic systolic (congestive) heart failure: Secondary | ICD-10-CM

## 2011-12-13 DIAGNOSIS — J4489 Other specified chronic obstructive pulmonary disease: Secondary | ICD-10-CM | POA: Insufficient documentation

## 2011-12-13 NOTE — Progress Notes (Signed)
Patient ID: Benjamin [name], male   DOB: 1956/08/29, 56 y.o.   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 He was admitted to [facility] in January with acute on chronic systolic heart failure complicated by cardiogenic shock. He was eventually transferred to [facility] and underwent bioprosthetic AoV replacement. Peri-op course was complicated by a VF arrest prior to going to the OR. F/u echo in March 2012, showed a well-functioning valve, however EF remains depressed at 20. Evaluated by Dr. Graciela Husbands for ICD evaluation but decided not to implant due to NYHA I fx capacity.   Discharged from [facility] 11/24/2011 L subdural hematoma . S/P evacuation hematoma.   He returns for post hospitalization follow up. Doing well after evacuation of hematoma in January. Walking 5 miles per day. Denies SOB/PND/CP/Orthopnea. Weight at home stable. Complaint with all medications.  Echo reviewed in clinic EF has normalized.   ROS: All systems negative except as listed in HPI, PMH and Problem List.  Past Medical History  Diagnosis Date  . COPD (chronic obstructive pulmonary disease)   . Biventricular CHF (congestive heart failure)     LVEF 20%  . 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 Aortic insufficiency     Moderate  . Aortic stenosis     Severe, bicuspid aortic valve. s/p bioprosthetic  AVR 1/12  . Alcohol abuse, in remission     Stopped in 9/11    Current Outpatient Prescriptions  Medication Sig Dispense Refill  . aspirin 81 MG tablet Take 160 mg by mouth daily.      . carvedilol (COREG) 6.25 MG tablet Take 6.25 mg by mouth 2 (two) times daily with a meal.      . lisinopril (PRINIVIL,ZESTRIL) 5 MG tablet Take 5 mg by mouth daily.      . Multiple Vitamins-Minerals (MULTIVITAMIN WITH MINERALS) tablet Take 1 tablet by mouth daily.        . vitamin B-12  (CYANOCOBALAMIN) 250 MCG tablet Take 250 mcg by mouth daily.           PHYSICAL EXAM: Filed Vitals:   12/13/11 1217  BP: 104/62  Pulse: 73   Weight change:   126 pounds (125) General:  Well appearing. No resp difficulty HEENT: normal L scalp incision approximated  Neck: supple. JVP flat. Carotids 2+ bilaterally; no bruits. No lymphadenopathy or thryomegaly appreciated. Cor: PMI normal. Regular rate & rhythm. No rubs, gallops or murmurs. Lungs: clear Abdomen: soft, nontender, nondistended. No hepatosplenomegaly. No bruits or masses. Good bowel sounds. Extremities: no cyanosis, clubbing, rash, edema Neuro: alert & orientedx3, cranial nerves grossly intact. Moves all 4 extremities w/o difficulty. Affect pleasant.    ASSESSMENT & PLAN:

## 2011-12-13 NOTE — Patient Instructions (Signed)
Follow up in 3 months.  Follow up with Dr Diona Browner in 2 months  Do the following things EVERYDAY: 1) Weigh yourself in the morning before breakfast. Write it down and keep it in a log. 2) Take your medicines as prescribed 3) Eat low salt foods-Limit salt (sodium) to 2000mg  per day.  4) Stay as active as you can everyday

## 2011-12-13 NOTE — Assessment & Plan Note (Addendum)
NYHA I. Volume status stable. Continue current medical regimen. Follow up 3 months  Patient seen and examined with Tonye Becket, NP. We discussed all aspects of the encounter. I agree with the assessment and plan as stated above.  We reviewed echo in clinic and EF has normalized. Volume status looks great. BP soft. Will continue current regimen.

## 2011-12-14 ENCOUNTER — Ambulatory Visit: Payer: Self-pay | Admitting: Cardiology

## 2011-12-20 ENCOUNTER — Other Ambulatory Visit (HOSPITAL_COMMUNITY): Payer: Self-pay | Admitting: Neurosurgery

## 2011-12-20 DIAGNOSIS — S066X9A Traumatic subarachnoid hemorrhage with loss of consciousness of unspecified duration, initial encounter: Secondary | ICD-10-CM

## 2011-12-23 NOTE — Assessment & Plan Note (Signed)
Stable on echo. 

## 2011-12-31 ENCOUNTER — Encounter (HOSPITAL_COMMUNITY): Payer: Self-pay

## 2011-12-31 ENCOUNTER — Ambulatory Visit (HOSPITAL_COMMUNITY)
Admission: RE | Admit: 2011-12-31 | Discharge: 2011-12-31 | Disposition: A | Discharge status: Home / Self Care | Payer: Medicaid Other | Source: Ambulatory Visit | Attending: Neurosurgery | Admitting: Neurosurgery

## 2011-12-31 DIAGNOSIS — S066X9A Traumatic subarachnoid hemorrhage with loss of consciousness of unspecified duration, initial encounter: Secondary | ICD-10-CM

## 2011-12-31 DIAGNOSIS — I619 Nontraumatic intracerebral hemorrhage, unspecified: Secondary | ICD-10-CM | POA: Insufficient documentation

## 2012-01-18 ENCOUNTER — Other Ambulatory Visit: Payer: Self-pay | Admitting: Cardiology

## 2012-01-19 LAB — BASIC METABOLIC PANEL
CO2: 25 mEq/L (ref 19–32)
Calcium: 9.8 mg/dL (ref 8.4–10.5)
Chloride: 102 mEq/L (ref 96–112)
Glucose, Bld: 97 mg/dL (ref 70–99)
Sodium: 140 mEq/L (ref 135–145)

## 2012-01-29 IMAGING — CR DG CHEST 2V
2 series · 2 of 2 positions shown · non-contrast
Comparison: Chest x-ray of 02/27/2010

CLINICAL DATA: Chest pain, shortness of breath, weakness

CHEST - 2 VIEW

[view not recorded (1 of 2)]
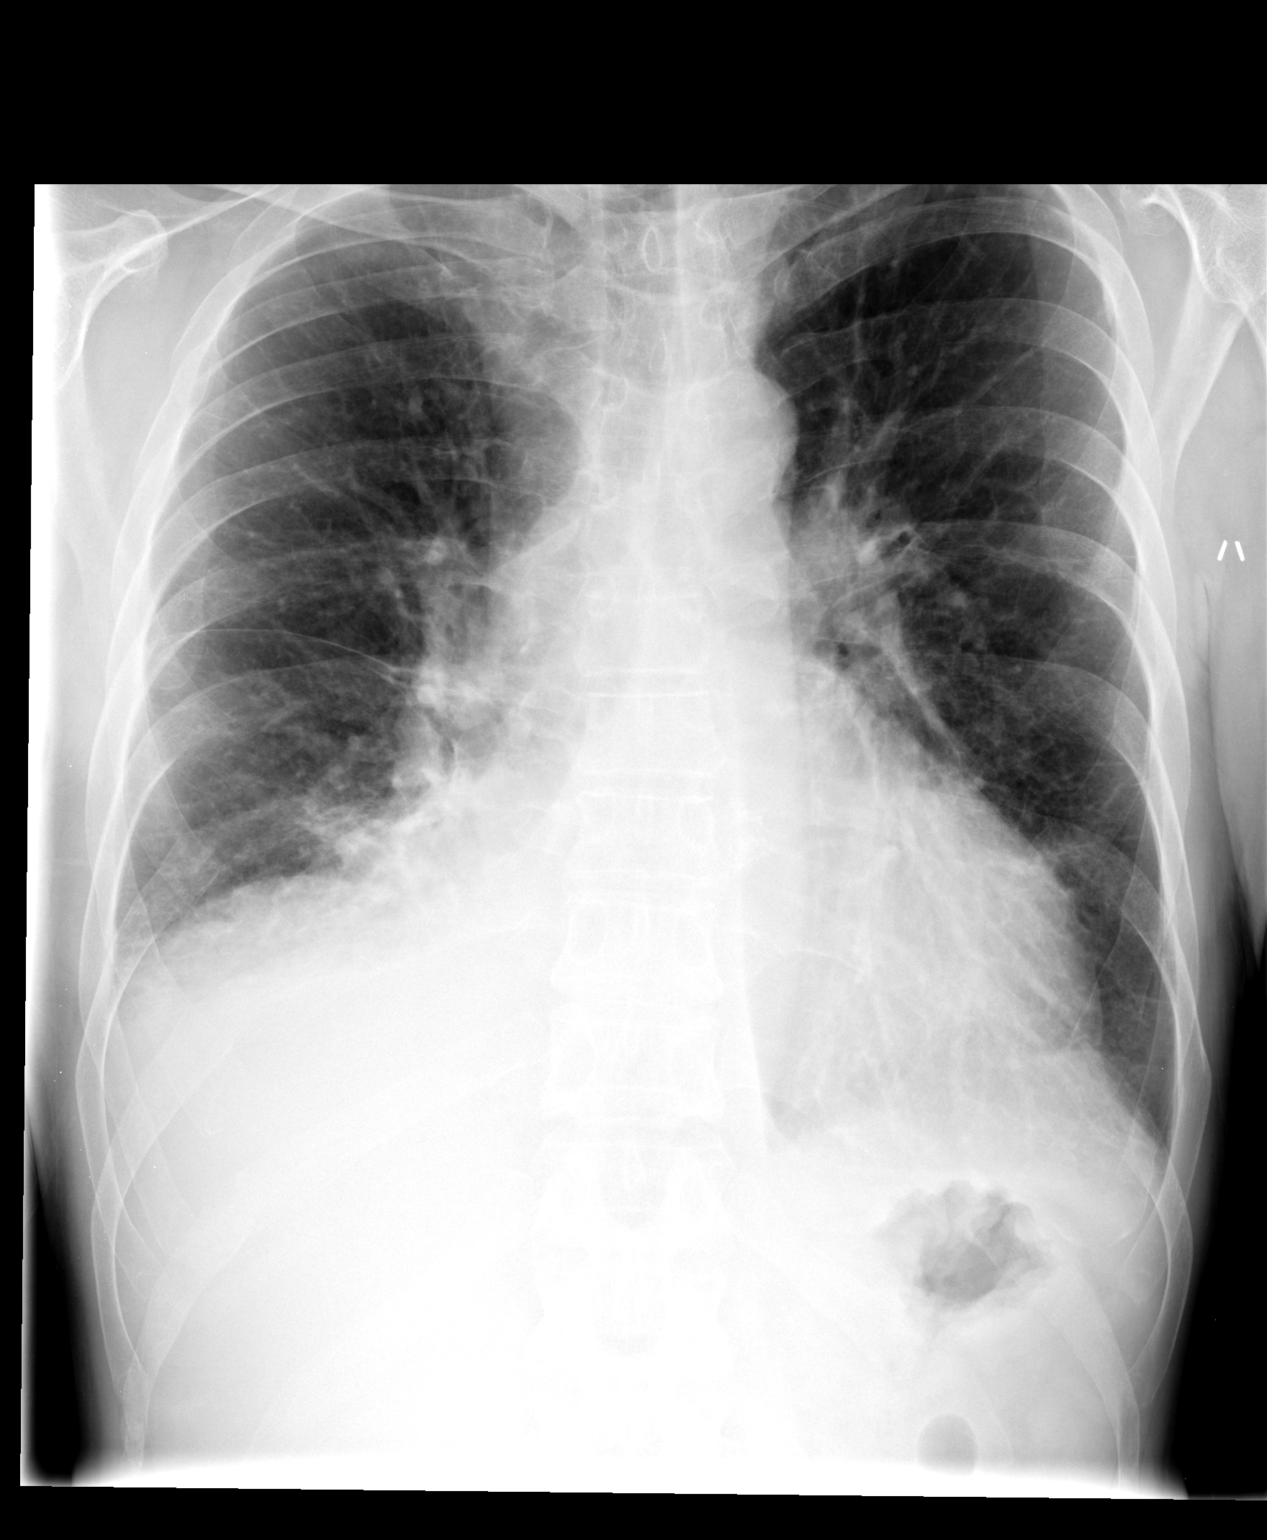

[view not recorded (2 of 2)]
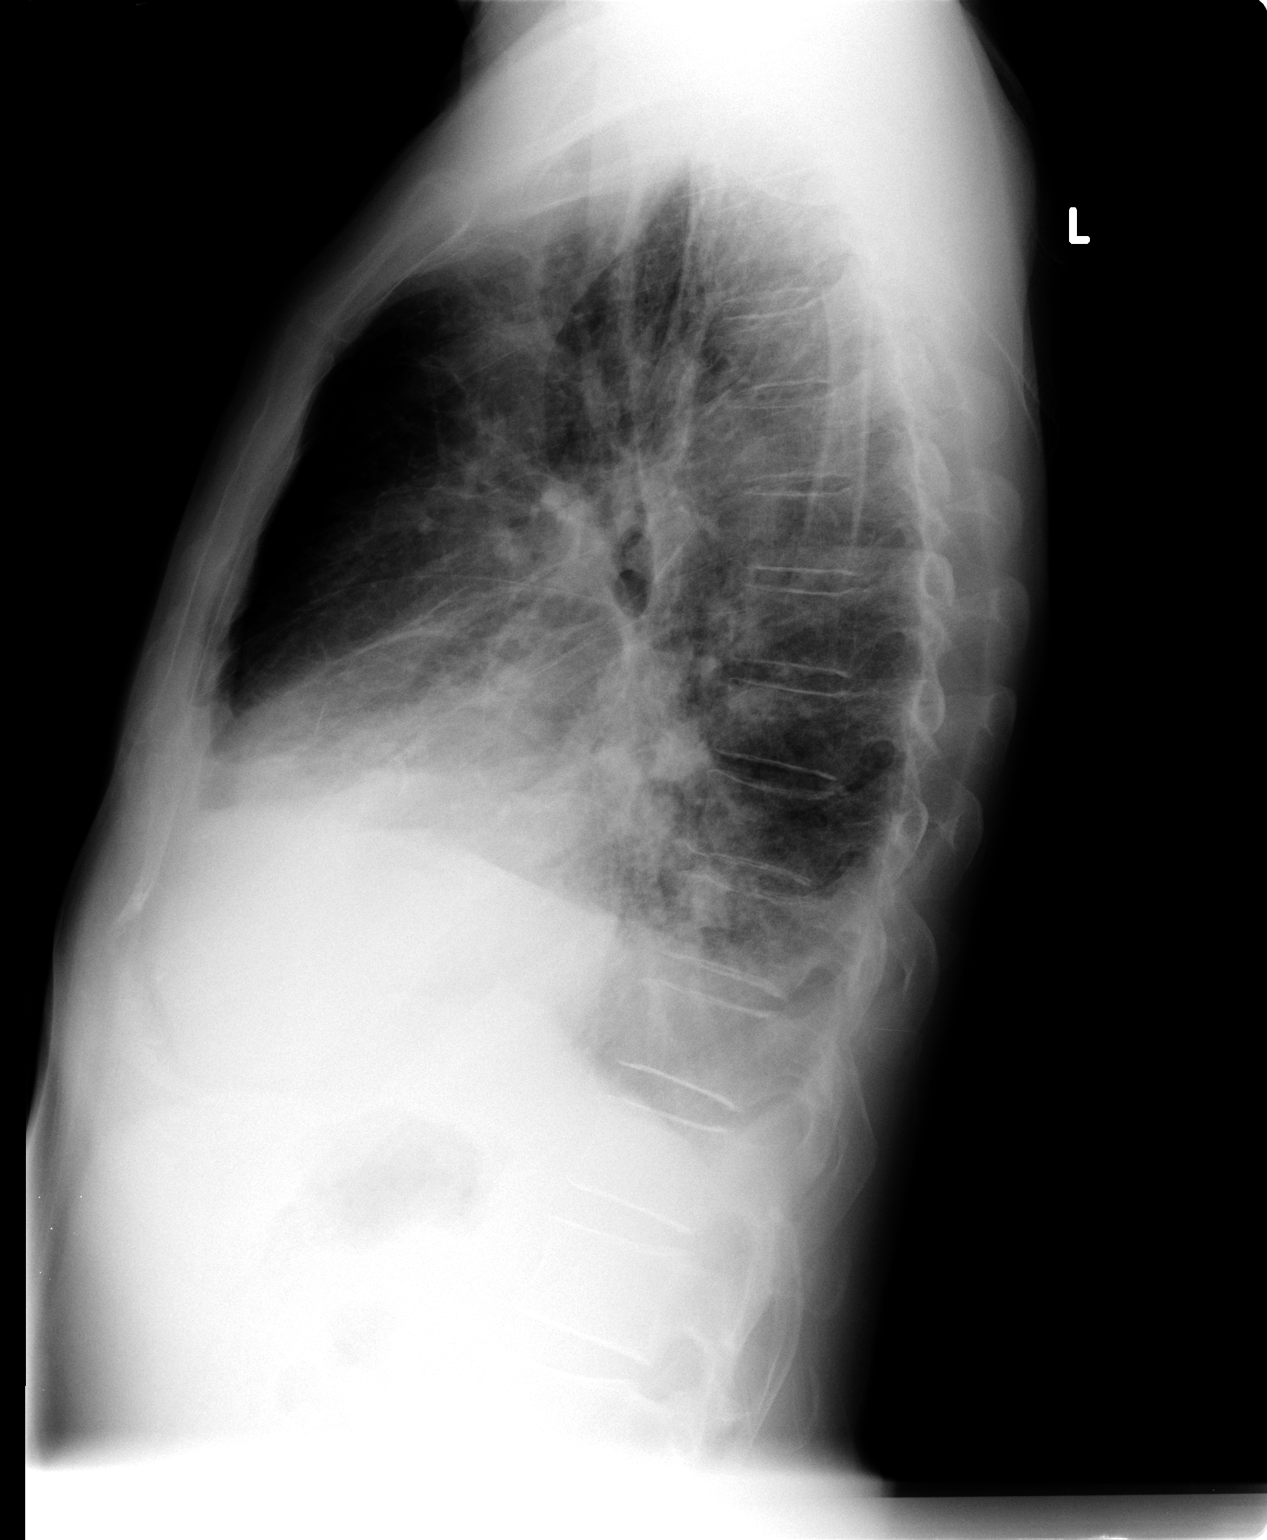

[2 of 2 positions shown; findings below may reference images not displayed]

FINDINGS: There is opacity at the right lung base which could be
due to atelectasis and pneumonia with probable small effusions.
However, there is cardiomegaly present and there are prominent
interstitial markings most indicative of mild interstitial edema.
No bony abnormality is seen.
IMPRESSION: Probable interstitial edema with tiny effusions.  Difficult to
exclude pneumonia particularly at the right lung base.

## 2012-01-29 IMAGING — CT CT CHEST W/ CM
2 of 3 series · 15 of 36 positions shown, 18 images · IV contrast (Omnipaque 300)
Comparison: Plain film earlier today.  No prior CT.

CLINICAL DATA: Short of breath for 2 weeks.  Quit smoking 5 months
ago.  History COPD.  Emphysema.  Chest pain.

CT CHEST WITH CONTRAST
TECHNIQUE: Multidetector CT imaging of the chest was performed
following the standard protocol during bolus administration of
intravenous contrast.
Contrast: 80 ml Mmnipaque-XHH

[Series 2: chestroutine 5.0 b40f · axial · 0.69mm/px · z∈[-381,-91]mm · 12 of 70 slices shown, 15 images]
[im 6/70  mediastinal]
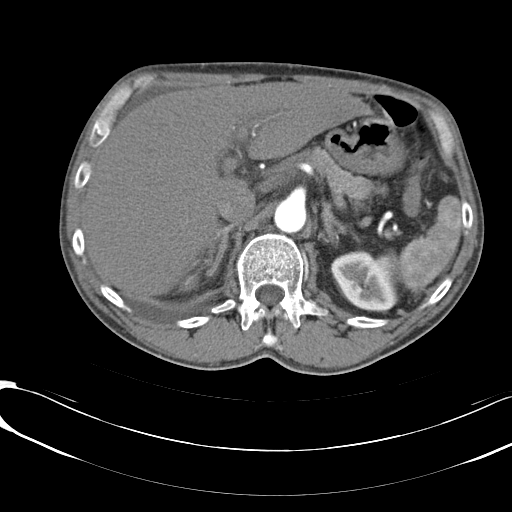
[im 6/70  lung]
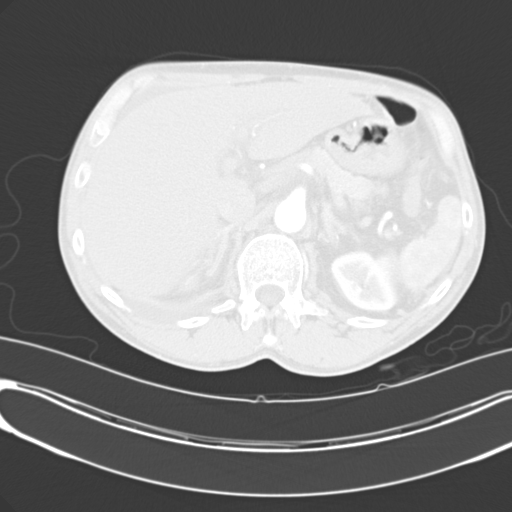
[im 11/70  lung]
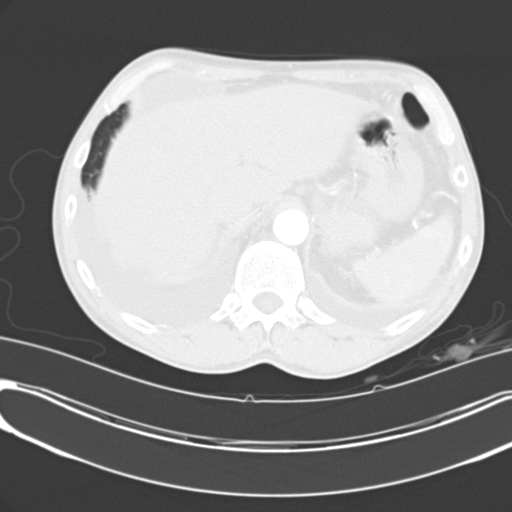
[im 16/70  lung]
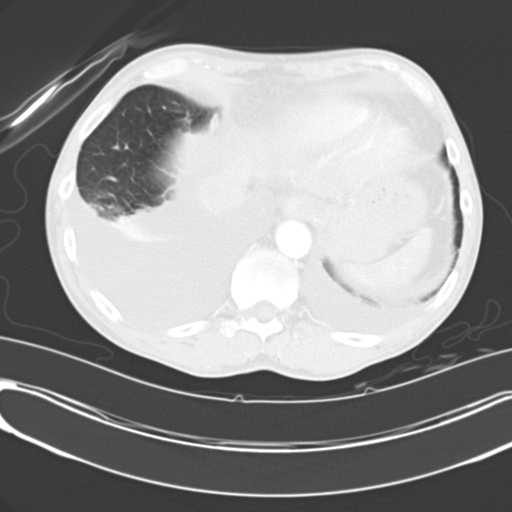
[im 21/70  lung]
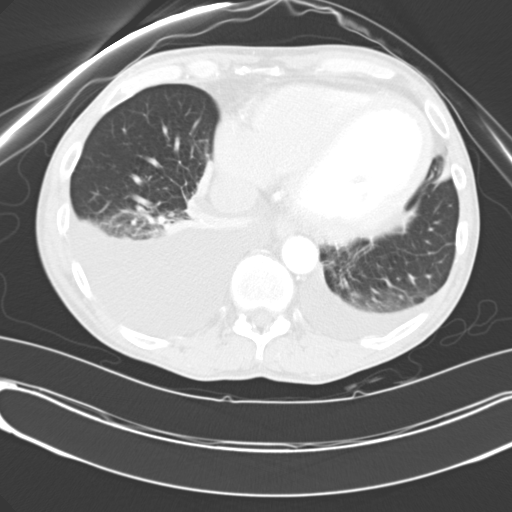
[im 26/70  mediastinal]
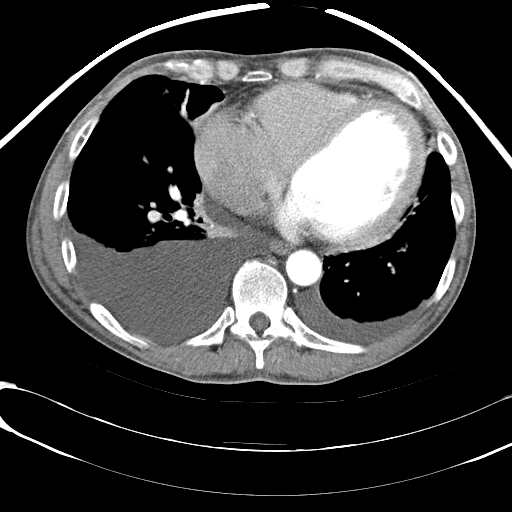
[im 26/70  lung]
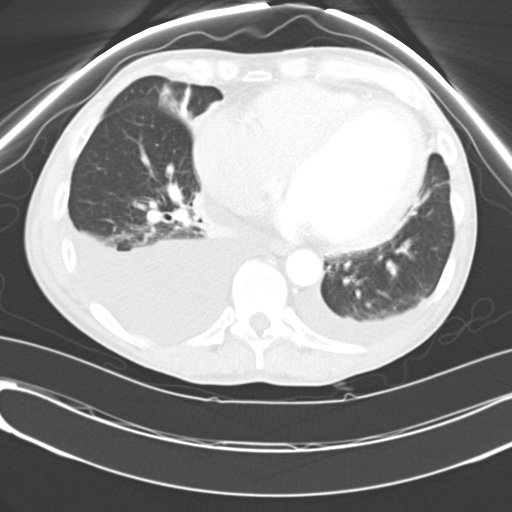
[im 31/70  lung]
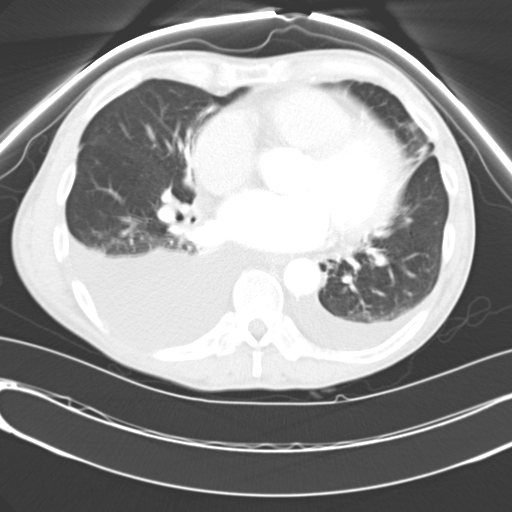
[im 39/70  lung]
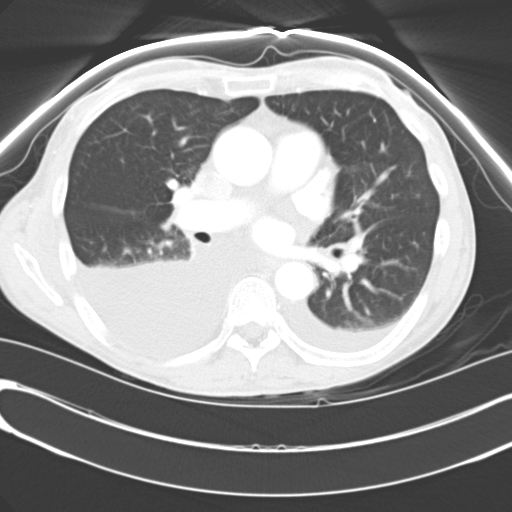
[im 44/70  lung]
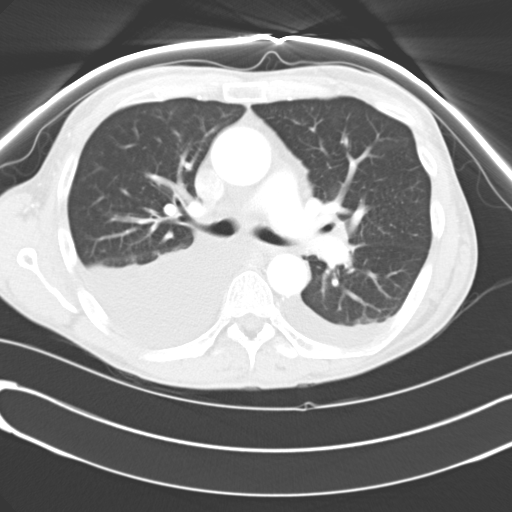
[im 49/70  mediastinal]
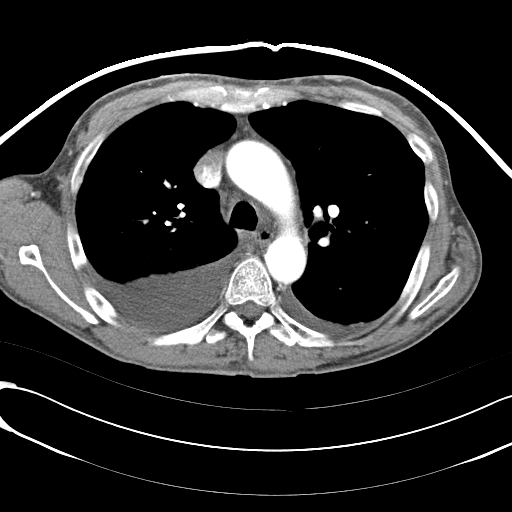
[im 49/70  lung]
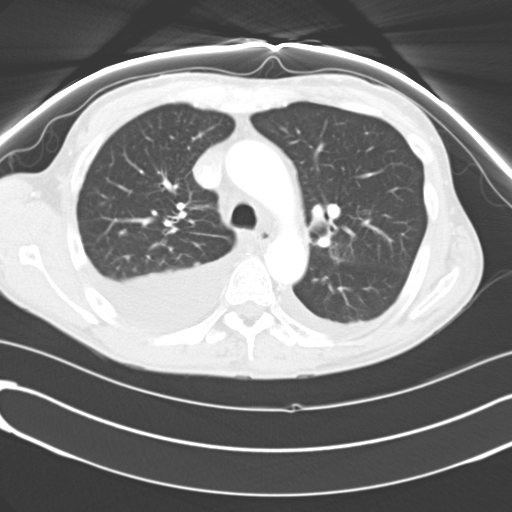
[im 54/70  lung]
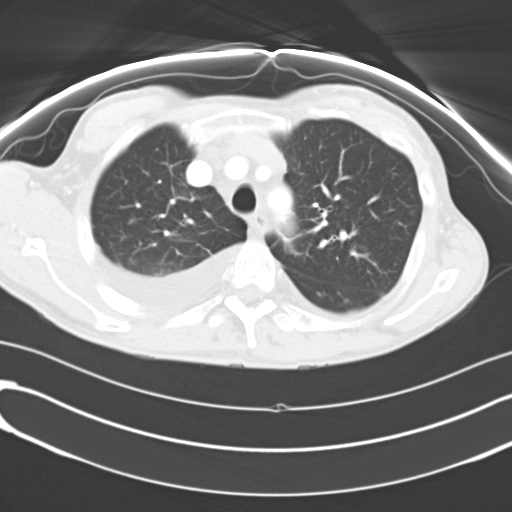
[im 59/70  lung]
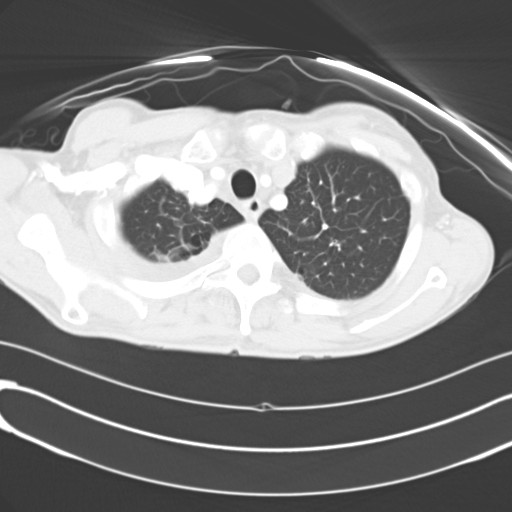
[im 64/70  lung]
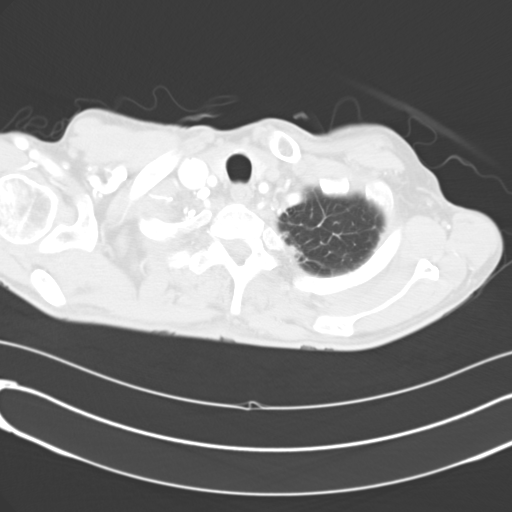

[Series 4: mpr coronal chest 3mm · coronal · 0.63mm/px · 3 of 73 slices shown]
[im 15/73  lung]
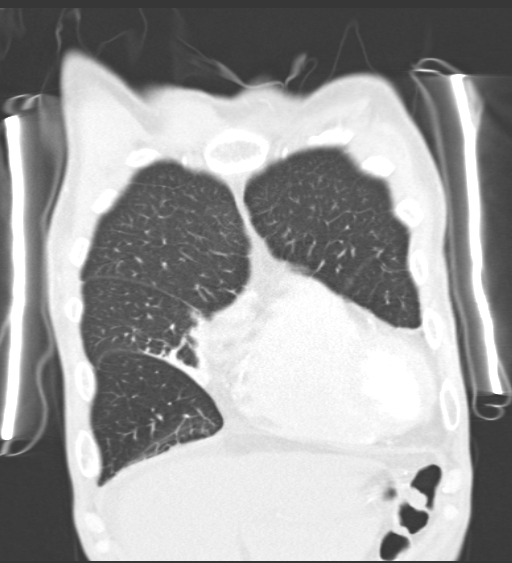
[im 29/73  lung]
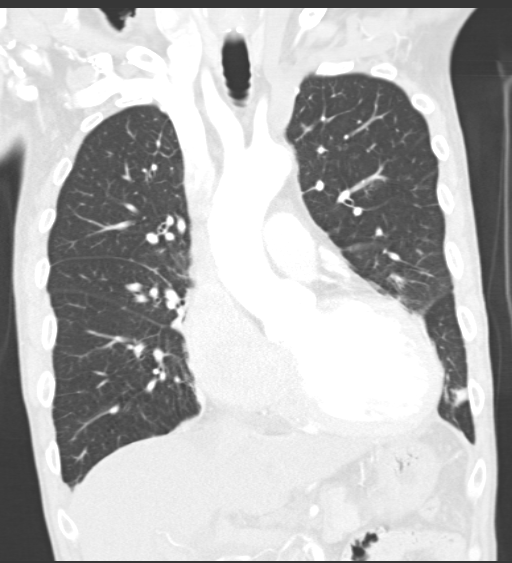
[im 44/73  lung]
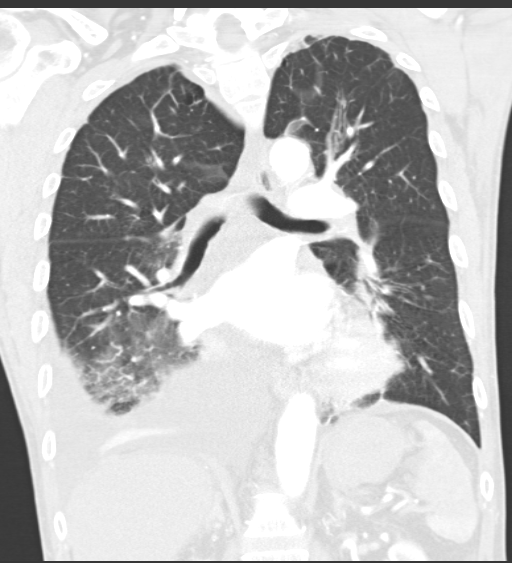

[15 of 36 positions shown; findings below may reference images not displayed]

FINDINGS: Lung windows demonstrate minimal motion artifact.  Pain
bronchial tree, including to the right lower lobe.

Mild septal thickening.  Volume loss/atelectasis in the right
middle lobe.

Right lower lobe dependent atelectasis.

Soft tissue windows demonstrate ascending aorta upper limits of
normal, 3.7 cm.  Aortic valvular calcifications suggest valvular
disease/stenosis.  Example image 40.  Moderate cardiomegaly.  No
pericardial effusion.  Moderate right and small left pleural
effusions both layer dependently.

Borderline enlarged low right paratracheal 1.0 cm lymph node image
26.  There is diffuse mesenteric edema.  No hilar adenopathy.

Limited abdominal imaging demonstrates small volume perihepatic and
perisplenic ascites.  A sclerotic lesion involves the right side of
the C7 vertebral body.
IMPRESSION: 1.  Right greater than left pleural effusions with right base
atelectasis.
2.  Cardiomegaly.  Question a component of congestive heart
failure, given septal thickening and pleural fluid.
3.  Calcifications in the aortic valve leaflets suspicious for
aortic valvular disease/stenosis.  Consider echocardiography.
4.  Small volume abdominal ascites is nonspecific but may relate to
CHF/fluid overload.
5.  C7 sclerotic lesion.  Presuming no history of primary
malignancy, most likely a bone island.
6.  Borderline mediastinal adenopathy with mediastinal edema.  Most
likely also related to CHF/fluid overload.

## 2012-01-30 ENCOUNTER — Encounter: Payer: Self-pay | Admitting: Cardiology

## 2012-01-30 ENCOUNTER — Ambulatory Visit (INDEPENDENT_AMBULATORY_CARE_PROVIDER_SITE_OTHER): Payer: Self-pay | Admitting: Physician Assistant

## 2012-01-30 VITALS — BP 116/77 | HR 82 | Resp 16 | Ht 71.0 in | Wt 129.0 lb

## 2012-01-30 DIAGNOSIS — Z954 Presence of other heart-valve replacement: Secondary | ICD-10-CM

## 2012-01-30 DIAGNOSIS — I429 Cardiomyopathy, unspecified: Secondary | ICD-10-CM

## 2012-01-30 DIAGNOSIS — J449 Chronic obstructive pulmonary disease, unspecified: Secondary | ICD-10-CM

## 2012-01-30 DIAGNOSIS — F1010 Alcohol abuse, uncomplicated: Secondary | ICD-10-CM

## 2012-01-30 DIAGNOSIS — J4489 Other specified chronic obstructive pulmonary disease: Secondary | ICD-10-CM
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
## 2012-01-30 NOTE — Patient Instructions (Signed)
Your physician recommends that you schedule a follow-up appointment in: 3 months with Dr McDowell  

## 2012-01-30 NOTE — Progress Notes (Signed)
Patient seen and examined with Ms. Geni Bers. He is doing very well, reports NYHA class I dyspnea on exertion, no chest pain or palpitations. He is not having any dizziness. Medications were reviewed, and he seems to be tolerating these well. Continues with no further tobacco or alcohol use. Still walking regularly. Notably, his followup echocardiogram from February showed normalization of LVEF to the range of 60-65%. We discussed this today. He seems to be very encouraged, We will continue regular followup.  Jonelle Sidle, M.D., F.A.C.C.

## 2012-01-30 NOTE — Assessment & Plan Note (Signed)
Patient's 2-D echo in February 2013 showed normalization of his ejection fraction 60-65%. The patient is now able to walk 4-7 miles daily without difficulty. His medications have been cut back and he is doing extremely well.

## 2012-01-30 NOTE — Assessment & Plan Note (Addendum)
Aortic valve replacement in February 2012 at [facility]. He is doing well. 2-D echo in February 2013 showed Moderately calcified leaflets. Doppler: There was very mild stenosis. VTI ratio of LVOT to aortic valve: 0.38. Valve area: 1.31cm^2(VTI). Indexed valve area: 0.74cm^2/m^2 (VTI). Peak velocity ratio of LVOT to aortic valve: 0.38. Valve area: 1.31cm^2 (Vmax). Indexed valve area: 0.74cm^2/m^2 (Vmax). Mean gradient: 12mm Hg (S). Peak gradient: 19mm Hg (S).

## 2012-01-30 NOTE — Assessment & Plan Note (Signed)
Patient quit smoking 2 years ago.

## 2012-01-30 NOTE — Assessment & Plan Note (Signed)
Patient continues to abstain from alcohol.

## 2012-03-13 ENCOUNTER — Encounter (HOSPITAL_COMMUNITY): Payer: Self-pay

## 2012-03-13 ENCOUNTER — Ambulatory Visit (HOSPITAL_COMMUNITY)
Admission: RE | Admit: 2012-03-13 | Discharge: 2012-03-13 | Disposition: A | Discharge status: Home / Self Care | Payer: Self-pay | Source: Ambulatory Visit | Attending: Internal Medicine | Admitting: Internal Medicine

## 2012-03-13 VITALS — BP 84/62 | HR 86 | Resp 18 | Ht 71.0 in | Wt 127.1 lb

## 2012-03-13 DIAGNOSIS — J449 Chronic obstructive pulmonary disease, unspecified: Secondary | ICD-10-CM | POA: Insufficient documentation

## 2012-03-13 DIAGNOSIS — J4489 Other specified chronic obstructive pulmonary disease: Secondary | ICD-10-CM | POA: Insufficient documentation

## 2012-03-13 DIAGNOSIS — Z7982 Long term (current) use of aspirin: Secondary | ICD-10-CM | POA: Insufficient documentation

## 2012-03-13 DIAGNOSIS — Z79899 Other long term (current) drug therapy: Secondary | ICD-10-CM | POA: Insufficient documentation

## 2012-03-13 DIAGNOSIS — I359 Nonrheumatic aortic valve disorder, unspecified: Secondary | ICD-10-CM | POA: Insufficient documentation

## 2012-03-13 DIAGNOSIS — I5022 Chronic systolic (congestive) heart failure: Secondary | ICD-10-CM | POA: Insufficient documentation

## 2012-03-13 NOTE — Assessment & Plan Note (Addendum)
He returns for follow up and remains stable. Doing great. EF recovered. Continues to walk several miles as weather permits. He will follow up with Dr Diona Browner. Follow up at [facility] if needed.

## 2012-03-13 NOTE — Progress Notes (Signed)
Patient ID: Benjamin [name], male   DOB: 13-Nov-1955, 56 y.o.   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 He was admitted to [facility] in January with acute on chronic systolic heart failure complicated by cardiogenic shock. He was eventually transferred to [facility] and underwent bioprosthetic AoV replacement. Peri-op course was complicated by a VF arrest prior to going to the OR. F/u echo in March 2012, showed a well-functioning valve, however EF remains depressed at 20. Evaluated by Dr. Graciela Husbands for ICD evaluation but decided not to implant due to NYHA I fx capacity.   Discharged from [facility] 11/24/2011 L subdural hematoma . S/P evacuation hematoma.   2/13 ECHO EF 60%  He returns for follow up. Denies SOB/PND/orthopnea. Continues to walk several miles daily weather permitting. Weight at home. Complaint with medications. Remains on disability.    ROS: All systems negative except as listed in HPI, PMH and Problem List.  Past Medical History  Diagnosis Date  . COPD (chronic obstructive pulmonary disease)   . Biventricular CHF (congestive heart failure)     LVEF 20%  . 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 Aortic insufficiency     Moderate  . Aortic stenosis     Severe, bicuspid aortic valve. s/p bioprosthetic  AVR 1/12  . Alcohol abuse, in remission     Stopped in 9/11    Current Outpatient Prescriptions  Medication Sig Dispense Refill  . aspirin 81 MG tablet Take 160 mg by mouth daily.      . carvedilol (COREG) 6.25 MG tablet Take 6.25 mg by mouth 2 (two) times daily with a meal.      . lisinopril (PRINIVIL,ZESTRIL) 5 MG tablet Take 5 mg by mouth daily.      . Multiple Vitamins-Minerals (MULTIVITAMIN WITH MINERALS) tablet Take 1 tablet by mouth daily.        . vitamin B-12 (CYANOCOBALAMIN) 250 MCG tablet Take 250  mcg by mouth daily.           PHYSICAL EXAM: Filed Vitals:   03/13/12 1317  BP: 84/62  Pulse: 86  Resp: 18   Weight change:   126 pounds (125) General:  Well appearing. No resp difficulty HEENT: normal L scalp scar Neck: supple. JVP flat. Carotids 2+ bilaterally; no bruits. No lymphadenopathy or thryomegaly appreciated. Cor: PMI normal. Regular rate & rhythm. No rubs, gallops or murmurs. Lungs: clear Abdomen: soft, nontender, nondistended. No hepatosplenomegaly. No bruits or masses. Good bowel sounds. Extremities: no cyanosis, clubbing, rash, edema Neuro: alert & orientedx3, cranial nerves grossly intact. Moves all 4 extremities w/o difficulty. Affect pleasant.    ASSESSMENT & PLAN:

## 2012-03-13 NOTE — Patient Instructions (Signed)
Please follow up with Dr Diona Browner.

## 2012-05-05 ENCOUNTER — Encounter: Payer: Self-pay | Admitting: Cardiology

## 2012-05-05 ENCOUNTER — Ambulatory Visit (INDEPENDENT_AMBULATORY_CARE_PROVIDER_SITE_OTHER): Payer: Self-pay | Admitting: Cardiology

## 2012-05-05 VITALS — BP 90/60 | HR 85 | Ht 71.0 in | Wt 124.0 lb

## 2012-05-05 DIAGNOSIS — I429 Cardiomyopathy, unspecified: Secondary | ICD-10-CM

## 2012-05-05 DIAGNOSIS — Z954 Presence of other heart-valve replacement: Secondary | ICD-10-CM

## 2012-05-05 NOTE — Assessment & Plan Note (Signed)
Doing well status post bioprosthetic AVR in January 2012.

## 2012-05-05 NOTE — Patient Instructions (Addendum)
**Note De-Identified Conchita Truxillo Obfuscation** Your physician recommends that you continue on your current medications as directed. Please refer to the Current Medication list given to you today.  Your physician recommends that you return for lab work in: 6 months (just before your next visit)  Your physician recommends that you schedule a follow-up appointment in: 6 months
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
## 2012-05-05 NOTE — Assessment & Plan Note (Signed)
Normalization of LV function as of echocardiogram in February. He is doing quite well at this point. Continue medical therapy and observation. Followup BMET for next visit in 6 months.

## 2012-06-05 ENCOUNTER — Other Ambulatory Visit: Payer: Self-pay | Admitting: Cardiology

## 2012-09-20 ENCOUNTER — Other Ambulatory Visit: Payer: Self-pay | Admitting: Cardiology

## 2012-09-22 NOTE — Telephone Encounter (Signed)
rx sent to pharmacy by e-script Noted pt will need follow up OV with Sm around 11-05-12, no recall/apt noted in chart, will attempt to contact pt to advise

## 2012-10-24 ENCOUNTER — Other Ambulatory Visit: Payer: Self-pay | Admitting: Cardiology

## 2012-10-25 LAB — BASIC METABOLIC PANEL
BUN: 8 mg/dL (ref 6–23)
CO2: 25 mEq/L (ref 19–32)
Calcium: 9.5 mg/dL (ref 8.4–10.5)
Creat: 0.69 mg/dL (ref 0.50–1.35)

## 2012-10-27 ENCOUNTER — Encounter: Payer: Self-pay | Admitting: *Deleted

## 2012-11-26 ENCOUNTER — Ambulatory Visit: Payer: Self-pay | Admitting: Cardiology

## 2012-12-29 ENCOUNTER — Ambulatory Visit (INDEPENDENT_AMBULATORY_CARE_PROVIDER_SITE_OTHER): Payer: Self-pay | Admitting: Cardiology

## 2012-12-29 ENCOUNTER — Encounter: Payer: Self-pay | Admitting: Cardiology

## 2012-12-29 VITALS — BP 122/84 | HR 88 | Ht 71.0 in | Wt 129.0 lb

## 2012-12-29 DIAGNOSIS — I429 Cardiomyopathy, unspecified: Secondary | ICD-10-CM

## 2012-12-29 DIAGNOSIS — Z954 Presence of other heart-valve replacement: Secondary | ICD-10-CM

## 2012-12-29 NOTE — Assessment & Plan Note (Signed)
Normalization of LVEF on medical therapy. Continue exercise and observation.

## 2012-12-29 NOTE — Assessment & Plan Note (Signed)
Bicuspid aortic valve with history of severe aortic stenosis and moderate regurgitation status post bioprosthetic AVR at [facility] in January 2012.
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
## 2012-12-29 NOTE — Patient Instructions (Addendum)
Your physician recommends that you schedule a follow-up appointment in: 6 months  

## 2013-02-02 ENCOUNTER — Other Ambulatory Visit: Payer: Self-pay | Admitting: Cardiology

## 2013-03-13 ENCOUNTER — Other Ambulatory Visit: Payer: Self-pay | Admitting: Cardiology

## 2013-04-21 ENCOUNTER — Telehealth: Payer: Self-pay | Admitting: *Deleted

## 2013-04-21 MED ORDER — CARVEDILOL 6.25 MG PO TABS: ORAL_TABLET | ORAL | Status: DC

## 2013-04-21 NOTE — Telephone Encounter (Signed)
Medication sent via escribe.  

## 2013-06-18 ENCOUNTER — Other Ambulatory Visit: Payer: Self-pay

## 2013-06-18 MED ORDER — LISINOPRIL 5 MG PO TABS: ORAL_TABLET | ORAL | Status: DC

## 2013-07-02 ENCOUNTER — Ambulatory Visit: Payer: Self-pay | Admitting: Cardiology

## 2013-07-13 ENCOUNTER — Ambulatory Visit (INDEPENDENT_AMBULATORY_CARE_PROVIDER_SITE_OTHER): Payer: Medicare Other | Admitting: Cardiology

## 2013-07-13 ENCOUNTER — Encounter: Payer: Self-pay | Admitting: Cardiology

## 2013-07-13 VITALS — BP 110/74 | HR 67 | Ht 71.0 in | Wt 132.5 lb

## 2013-07-13 DIAGNOSIS — I429 Cardiomyopathy, unspecified: Secondary | ICD-10-CM

## 2013-07-13 DIAGNOSIS — I359 Nonrheumatic aortic valve disorder, unspecified: Secondary | ICD-10-CM
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
## 2013-07-13 NOTE — Assessment & Plan Note (Signed)
Bicuspid aortic valve with history of severe aortic stenosis and moderate regurgitation status post bioprosthetic AVR at [facility] in January 2012.

## 2013-07-13 NOTE — Patient Instructions (Addendum)
Your physician recommends that you schedule a follow-up appointment in: 6 MONTHS 

## 2013-07-13 NOTE — Assessment & Plan Note (Signed)
Patient doing very well clinically on medical therapy. LVEF normalized by last assessment. Followup arranged in 6 months.

## 2013-09-09 ENCOUNTER — Telehealth: Payer: Self-pay | Admitting: Cardiology

## 2013-09-09 NOTE — Telephone Encounter (Signed)
Received fax refill request ° °Rx # [id number] °Medication:  Carvedilol 6.25mg tab °Qty 60 °Sig:  Take one tablet by mouth twice daily with meals °Physician:  McDowell ° ° °

## 2013-09-10 MED ORDER — CARVEDILOL 6.25 MG PO TABS: ORAL_TABLET | ORAL | Status: DC

## 2013-09-14 ENCOUNTER — Telehealth: Payer: Self-pay | Admitting: Cardiology

## 2013-09-14 MED ORDER — CARVEDILOL 6.25 MG PO TABS: ORAL_TABLET | ORAL | Status: DC

## 2013-09-14 NOTE — Telephone Encounter (Signed)
Medication sent via escribe.  

## 2013-09-14 NOTE — Telephone Encounter (Signed)
Received fax refill request  Rx # [id number] Medication:  Carvedilol 6.25mg  tab Qty 60 Sig:  Take one tablet by mouth twice daily with meals Physician:  Diona Browner

## 2014-01-25 ENCOUNTER — Encounter: Payer: Self-pay | Admitting: Cardiology

## 2014-01-25 ENCOUNTER — Ambulatory Visit (INDEPENDENT_AMBULATORY_CARE_PROVIDER_SITE_OTHER): Payer: Medicare Other | Admitting: Cardiology

## 2014-01-25 VITALS — BP 111/67 | HR 74 | Ht 71.0 in | Wt 128.0 lb

## 2014-01-25 DIAGNOSIS — Z954 Presence of other heart-valve replacement: Secondary | ICD-10-CM

## 2014-01-25 DIAGNOSIS — I429 Cardiomyopathy, unspecified: Secondary | ICD-10-CM

## 2014-01-25 DIAGNOSIS — Z952 Presence of prosthetic heart valve: Secondary | ICD-10-CM

## 2014-01-25 NOTE — Patient Instructions (Signed)
Your physician wants you to follow-up in: 6 months You will receive a reminder letter in the mail two months in advance. If you don't receive a letter, please call our office to schedule the follow-up appointment.     Your physician recommends that you continue on your current medications as directed. Please refer to the Current Medication list given to you today.      Thank you for choosing  [facility] !        

## 2014-01-25 NOTE — Assessment & Plan Note (Signed)
Doing very well, status [facility].
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
## 2014-01-25 NOTE — Assessment & Plan Note (Signed)
LVEF normalized to the range of 60-65% by last echocardiogram, remains symptomatically stable on medical therapy. No indication for followup testing at this time.

## 2014-02-04 ENCOUNTER — Other Ambulatory Visit: Payer: Self-pay | Admitting: Cardiology

## 2014-02-05 ENCOUNTER — Other Ambulatory Visit: Payer: Self-pay

## 2014-02-05 MED ORDER — LISINOPRIL 5 MG PO TABS: 5.0000 mg | ORAL_TABLET | Freq: Two times a day (BID) | ORAL | Status: DC

## 2014-02-05 NOTE — Progress Notes (Signed)
Per K.lawrence,refill rx as it was prescribed last time,lisinopril 5 mg bid

## 2014-08-09 ENCOUNTER — Ambulatory Visit (INDEPENDENT_AMBULATORY_CARE_PROVIDER_SITE_OTHER): Payer: Medicare Other | Admitting: Cardiology

## 2014-08-09 ENCOUNTER — Encounter: Payer: Self-pay | Admitting: Cardiology

## 2014-08-09 VITALS — BP 110/68 | HR 89 | Ht 71.0 in | Wt 124.0 lb

## 2014-08-09 DIAGNOSIS — I359 Nonrheumatic aortic valve disorder, unspecified: Secondary | ICD-10-CM

## 2014-08-09 DIAGNOSIS — E7800 Pure hypercholesterolemia, unspecified: Secondary | ICD-10-CM

## 2014-08-09 DIAGNOSIS — Z953 Presence of xenogenic heart valve: Secondary | ICD-10-CM

## 2014-08-09 DIAGNOSIS — I429 Cardiomyopathy, unspecified: Secondary | ICD-10-CM | POA: Diagnosis not present

## 2014-08-09 DIAGNOSIS — Z954 Presence of other heart-valve replacement: Secondary | ICD-10-CM

## 2014-08-09 NOTE — Patient Instructions (Signed)
Your physician wants you to follow-up in: 6 months You will receive a reminder letter in the mail two months in advance. If you don't receive a letter, please call our office to schedule the follow-up appointment.      Please get lab work before next visit  (BMET,Lipid)     Your physician recommends that you continue on your current medications as directed. Please refer to the Current Medication list given to you today.      Thank you for choosing [facility] !
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
## 2014-08-09 NOTE — Assessment & Plan Note (Signed)
Symptomatically quite stable with no change on examination. No indication for echocardiogram at this time. Continue observation.

## 2014-08-09 NOTE — Assessment & Plan Note (Signed)
Subsequent normalization of LV function following valve replacement and on medical therapy. Followup BMET on ACE inhibitor.

## 2014-09-28 ENCOUNTER — Other Ambulatory Visit: Payer: Self-pay | Admitting: Cardiology

## 2014-12-15 ENCOUNTER — Other Ambulatory Visit: Payer: Self-pay | Admitting: Cardiology

## 2015-02-04 DIAGNOSIS — I359 Nonrheumatic aortic valve disorder, unspecified: Secondary | ICD-10-CM | POA: Diagnosis not present

## 2015-02-04 DIAGNOSIS — E780 Pure hypercholesterolemia: Secondary | ICD-10-CM | POA: Diagnosis not present

## 2015-02-05 LAB — LIPID PANEL
Cholesterol: 195 mg/dL (ref 0–200)
HDL: 88 mg/dL (ref >=40)
LDL Cholesterol: 91 mg/dL (ref 0–99)
Total CHOL/HDL Ratio: 2.2 Ratio
Triglycerides: 81 mg/dL (ref <150)
VLDL: 16 mg/dL (ref 0–40)

## 2015-02-05 LAB — BASIC METABOLIC PANEL
BUN: 7 mg/dL (ref 6–23)
CALCIUM: 9.4 mg/dL (ref 8.4–10.5)
CHLORIDE: 105 meq/L (ref 96–112)
CO2: 29 mEq/L (ref 19–32)
Creat: 0.7 mg/dL (ref 0.50–1.35)
GLUCOSE: 98 mg/dL (ref 70–99)
Potassium: 4.2 mEq/L (ref 3.5–5.3)
Sodium: 141 mEq/L (ref 135–145)

## 2015-02-28 ENCOUNTER — Encounter: Payer: Self-pay | Admitting: Cardiology

## 2015-02-28 ENCOUNTER — Ambulatory Visit (INDEPENDENT_AMBULATORY_CARE_PROVIDER_SITE_OTHER): Payer: Medicare Other | Admitting: Cardiology

## 2015-02-28 VITALS — BP 108/78 | HR 84 | Ht 71.0 in | Wt 125.0 lb

## 2015-02-28 DIAGNOSIS — Z952 Presence of prosthetic heart valve: Secondary | ICD-10-CM

## 2015-02-28 DIAGNOSIS — I359 Nonrheumatic aortic valve disorder, unspecified: Secondary | ICD-10-CM

## 2015-02-28 DIAGNOSIS — Z954 Presence of other heart-valve replacement: Secondary | ICD-10-CM

## 2015-02-28 DIAGNOSIS — I429 Cardiomyopathy, unspecified: Secondary | ICD-10-CM

## 2015-02-28 NOTE — Patient Instructions (Signed)
Your physician wants you to follow-up in: 6 months with Dr McDowell You will receive a reminder letter in the mail two months in advance. If you don't receive a letter, please call our office to schedule the follow-up appointment.    Your physician recommends that you continue on your current medications as directed. Please refer to the Current Medication list given to you today.    Your physician has requested that you have an echocardiogram. Echocardiography is a painless test that uses sound waves to create images of your heart. It provides your doctor with information about the size and shape of your heart and how well your heart's chambers and valves are working. This procedure takes approximately one hour. There are no restrictions for this procedure.     Thank you for choosing [facility] !        
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
## 2015-06-15 ENCOUNTER — Other Ambulatory Visit: Payer: Self-pay | Admitting: Cardiology

## 2015-08-26 ENCOUNTER — Other Ambulatory Visit: Payer: Self-pay

## 2015-08-26 DIAGNOSIS — I359 Nonrheumatic aortic valve disorder, unspecified: Secondary | ICD-10-CM

## 2015-08-26 DIAGNOSIS — Z952 Presence of prosthetic heart valve: Secondary | ICD-10-CM

## 2015-08-29 ENCOUNTER — Ambulatory Visit (INDEPENDENT_AMBULATORY_CARE_PROVIDER_SITE_OTHER): Payer: Medicare Other | Admitting: Cardiology

## 2015-08-29 ENCOUNTER — Ambulatory Visit (HOSPITAL_COMMUNITY)
Admission: RE | Admit: 2015-08-29 | Discharge: 2015-08-29 | Disposition: A | Discharge status: Home / Self Care | Payer: Medicare Other | Source: Ambulatory Visit | Attending: Cardiology | Admitting: Cardiology

## 2015-08-29 ENCOUNTER — Encounter: Payer: Self-pay | Admitting: Cardiology

## 2015-08-29 VITALS — BP 102/62 | HR 76 | Ht 71.0 in | Wt 127.0 lb

## 2015-08-29 DIAGNOSIS — Z952 Presence of prosthetic heart valve: Secondary | ICD-10-CM

## 2015-08-29 DIAGNOSIS — I350 Nonrheumatic aortic (valve) stenosis: Secondary | ICD-10-CM

## 2015-08-29 DIAGNOSIS — I359 Nonrheumatic aortic valve disorder, unspecified: Secondary | ICD-10-CM

## 2015-08-29 DIAGNOSIS — Z954 Presence of other heart-valve replacement: Secondary | ICD-10-CM | POA: Diagnosis not present

## 2015-08-29 DIAGNOSIS — J449 Chronic obstructive pulmonary disease, unspecified: Secondary | ICD-10-CM

## 2015-08-29 DIAGNOSIS — F1010 Alcohol abuse, uncomplicated: Secondary | ICD-10-CM | POA: Diagnosis not present

## 2015-08-29 DIAGNOSIS — I429 Cardiomyopathy, unspecified: Secondary | ICD-10-CM | POA: Diagnosis not present

## 2015-08-29 DIAGNOSIS — I358 Other nonrheumatic aortic valve disorders: Secondary | ICD-10-CM | POA: Diagnosis not present

## 2015-08-29 DIAGNOSIS — F1011 Alcohol abuse, in remission: Secondary | ICD-10-CM
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
## 2015-08-29 NOTE — Patient Instructions (Signed)
Your physician wants you to follow-up in: 6 months You will receive a reminder letter in the mail two months in advance. If you don't receive a letter, please call our office to schedule the follow-up appointment.    Your physician recommends that you continue on your current medications as directed. Please refer to the Current Medication list given to you today.    If you need a refill on your cardiac medications before your next appointment, please call your pharmacy.     Thank you for choosing [facility] !        

## 2016-03-12 ENCOUNTER — Ambulatory Visit (INDEPENDENT_AMBULATORY_CARE_PROVIDER_SITE_OTHER): Payer: Medicare Other | Admitting: Cardiology

## 2016-03-12 ENCOUNTER — Encounter: Payer: Self-pay | Admitting: Cardiology

## 2016-03-12 VITALS — BP 104/60 | HR 87 | Ht 71.0 in | Wt 132.0 lb

## 2016-03-12 DIAGNOSIS — Z8679 Personal history of other diseases of the circulatory system: Secondary | ICD-10-CM

## 2016-03-12 DIAGNOSIS — Z952 Presence of prosthetic heart valve: Secondary | ICD-10-CM

## 2016-03-12 DIAGNOSIS — Z954 Presence of other heart-valve replacement: Secondary | ICD-10-CM | POA: Diagnosis not present
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
## 2016-03-12 NOTE — Patient Instructions (Signed)
Your physician wants you to follow-up in: 6 months You will receive a reminder letter in the mail two months in advance. If you don't receive a letter, please call our office to schedule the follow-up appointment.    Your physician recommends that you continue on your current medications as directed. Please refer to the Current Medication list given to you today.    If you need a refill on your cardiac medications before your next appointment, please call your pharmacy.     Thank you for choosing [facility] !        

## 2016-03-13 ENCOUNTER — Other Ambulatory Visit: Payer: Self-pay | Admitting: Cardiology

## 2016-04-06 ENCOUNTER — Other Ambulatory Visit: Payer: Self-pay | Admitting: Cardiology

## 2016-04-09 ENCOUNTER — Other Ambulatory Visit: Payer: Self-pay | Admitting: *Deleted

## 2016-04-09 ENCOUNTER — Other Ambulatory Visit: Payer: Self-pay

## 2016-04-09 MED ORDER — LISINOPRIL 5 MG PO TABS: 5.0000 mg | ORAL_TABLET | Freq: Every day | ORAL | Status: DC

## 2016-04-09 NOTE — Telephone Encounter (Signed)
rx refill was was for lisinopril BID,per MD,dose is daily,Pt overdue for 6 mon apt in April,#30 given

## 2016-06-28 ENCOUNTER — Encounter: Payer: Self-pay | Admitting: Cardiology

## 2016-08-09 ENCOUNTER — Other Ambulatory Visit: Payer: Self-pay | Admitting: Cardiology

## 2016-10-22 ENCOUNTER — Ambulatory Visit: Payer: Medicare Other | Admitting: Cardiology
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
## 2016-11-13 ENCOUNTER — Ambulatory Visit (INDEPENDENT_AMBULATORY_CARE_PROVIDER_SITE_OTHER): Payer: Medicare Other | Admitting: Cardiology

## 2016-11-13 ENCOUNTER — Encounter: Payer: Self-pay | Admitting: Cardiology

## 2016-11-13 VITALS — BP 102/72 | HR 81 | Ht 71.0 in | Wt 132.0 lb

## 2016-11-13 DIAGNOSIS — Z952 Presence of prosthetic heart valve: Secondary | ICD-10-CM | POA: Diagnosis not present

## 2016-11-13 DIAGNOSIS — Z8679 Personal history of other diseases of the circulatory system: Secondary | ICD-10-CM

## 2016-11-13 MED ORDER — CARVEDILOL 6.25 MG PO TABS: 6.2500 mg | ORAL_TABLET | Freq: Two times a day (BID) | ORAL | 3 refills | Status: DC

## 2016-11-13 MED ORDER — LISINOPRIL 5 MG PO TABS: 5.0000 mg | ORAL_TABLET | Freq: Every day | ORAL | 3 refills | Status: DC

## 2016-11-13 NOTE — Patient Instructions (Signed)
Your physician wants you to follow-up in: 1 year with Dr Randa SpikeMcDowell You will receive a reminder letter in the mail two months in advance. If you don't receive a letter, please call our office to schedule the follow-up appointment.   Your physician has requested that you have an echocardiogram JUST BEFORE NEXT VISIT IN 1 YEAR. Echocardiography is a painless test that uses sound waves to create images of your heart. It provides your doctor with information about the size and shape of your heart and how well your heart's chambers and valves are working. This procedure takes approximately one hour. There are no restrictions for this procedure.     Your physician recommends that you continue on your current medications as directed. Please refer to the Current Medication list given to you today.    If you need a refill on your cardiac medications before your next appointment, please call your pharmacy.    Thank you for choosing [facility] !

## 2017-11-21 ENCOUNTER — Ambulatory Visit: Payer: Medicare Other | Admitting: Cardiology
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
## 2017-12-11 ENCOUNTER — Encounter: Payer: Self-pay | Admitting: Cardiology

## 2017-12-11 ENCOUNTER — Ambulatory Visit (INDEPENDENT_AMBULATORY_CARE_PROVIDER_SITE_OTHER): Payer: Medicare Other | Admitting: Cardiology

## 2017-12-11 VITALS — BP 118/68 | HR 73 | Ht 71.0 in | Wt 128.0 lb

## 2017-12-11 DIAGNOSIS — F17201 Nicotine dependence, unspecified, in remission: Secondary | ICD-10-CM | POA: Diagnosis not present

## 2017-12-11 DIAGNOSIS — F1011 Alcohol abuse, in remission: Secondary | ICD-10-CM

## 2017-12-11 DIAGNOSIS — Z8679 Personal history of other diseases of the circulatory system: Secondary | ICD-10-CM

## 2017-12-11 DIAGNOSIS — Z952 Presence of prosthetic heart valve: Secondary | ICD-10-CM

## 2017-12-11 NOTE — Patient Instructions (Signed)
Medication Instructions:  Your physician recommends that you continue on your current medications as directed. Please refer to the Current Medication list given to you today.   Labwork: NONE   Testing/Procedures: NONE  Follow-Up: Your physician wants you to follow-up in: 1 Year with Dr. McDowell. You will receive a reminder letter in the mail two months in advance. If you don't receive a letter, please call our office to schedule the follow-up appointment.   Any Other Special Instructions Will Be Listed Below (If Applicable).     If you need a refill on your cardiac medications before your next appointment, please call your pharmacy.  Thank you for choosing [facility]!   

## 2018-02-06 ENCOUNTER — Other Ambulatory Visit: Payer: Self-pay | Admitting: Cardiology

## 2018-02-21 ENCOUNTER — Other Ambulatory Visit: Payer: Self-pay | Admitting: Cardiology

## 2018-11-21 ENCOUNTER — Other Ambulatory Visit (HOSPITAL_COMMUNITY)
Admission: RE | Admit: 2018-11-21 | Discharge: 2018-11-21 | Disposition: A | Discharge status: Home / Self Care | Payer: Medicare Other | Source: Ambulatory Visit | Attending: Student | Admitting: Student

## 2018-11-21 ENCOUNTER — Encounter: Payer: Self-pay | Admitting: Student

## 2018-11-21 ENCOUNTER — Ambulatory Visit (INDEPENDENT_AMBULATORY_CARE_PROVIDER_SITE_OTHER): Payer: Medicare Other | Admitting: Student

## 2018-11-21 VITALS — BP 90/56 | HR 81 | Ht 71.0 in | Wt 136.0 lb

## 2018-11-21 DIAGNOSIS — I10 Essential (primary) hypertension: Secondary | ICD-10-CM

## 2018-11-21 DIAGNOSIS — Z952 Presence of prosthetic heart valve: Secondary | ICD-10-CM | POA: Diagnosis not present

## 2018-11-21 DIAGNOSIS — R600 Localized edema: Secondary | ICD-10-CM

## 2018-11-21 DIAGNOSIS — Z8679 Personal history of other diseases of the circulatory system: Secondary | ICD-10-CM | POA: Diagnosis not present

## 2018-11-21 DIAGNOSIS — I350 Nonrheumatic aortic (valve) stenosis: Secondary | ICD-10-CM | POA: Diagnosis not present

## 2018-11-21 LAB — BASIC METABOLIC PANEL
Anion gap: 5 (ref 5–15)
BUN: <5 mg/dL — ABNORMAL LOW (ref 8–23)
CHLORIDE: 104 mmol/L (ref 98–111)
CO2: 25 mmol/L (ref 22–32)
Calcium: 8.3 mg/dL — ABNORMAL LOW (ref 8.9–10.3)
Creatinine, Ser: 0.45 mg/dL — ABNORMAL LOW (ref 0.61–1.24)
GFR calc Af Amer: >60 mL/min (ref >60)
GFR calc non Af Amer: >60 mL/min (ref >60)
Glucose, Bld: 106 mg/dL — ABNORMAL HIGH (ref 70–99)
Potassium: 4.3 mmol/L (ref 3.5–5.1)
Sodium: 134 mmol/L — ABNORMAL LOW (ref 135–145)

## 2018-11-21 LAB — BRAIN NATRIURETIC PEPTIDE: B Natriuretic Peptide: 309 pg/mL — ABNORMAL HIGH (ref 0.0–100.0)

## 2018-11-21 MED ORDER — FUROSEMIDE 20 MG PO TABS: 20.0000 mg | ORAL_TABLET | Freq: Every day | ORAL | 3 refills | Status: DC
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
## 2018-11-21 NOTE — Patient Instructions (Signed)
Medication Instructions:  Your physician has recommended you make the following change in your medication:  Stop taking Lisinopril  Start Lasix 20 mg Daily   If you need a refill on your cardiac medications before your next appointment, please call your pharmacy.   Lab work: Your physician recommends that you return for lab work in: Today   If you have labs (blood work) drawn today and your tests are completely normal, you will receive your results only by: Marland Kitchen MyChart Message (if you have MyChart) OR . A paper copy in the mail If you have any lab test that is abnormal or we need to change your treatment, we will call you to review the results.  Testing/Procedures: Your physician has requested that you have an echocardiogram. Echocardiography is a painless test that uses sound waves to create images of your heart. It provides your doctor with information about the size and shape of your heart and how well your heart's chambers and valves are working. This procedure takes approximately one hour. There are no restrictions for this procedure.    Follow-Up: At [facility], you and your health needs are our priority.  As part of our continuing mission to provide you with exceptional heart care, we have created designated Provider Care Teams.  These Care Teams include your primary Cardiologist (physician) and Advanced Practice Providers (APPs -  Physician Assistants and Nurse Practitioners) who all work together to provide you with the care you need, when you need it. You will need a follow up appointment in 1 weeks.  Please call our office 2 months in advance to schedule this appointment.  You may see Nona Dell, MD or one of the following Advanced Practice Providers on your designated Care Team:   Randall An, PA-C [facility]) . Jacolyn Reedy, PA-C [facility] Office)  Any Other Special Instructions Will Be Listed Below (If Applicable). Thank you for choosing [facility]!
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
## 2018-11-26 ENCOUNTER — Ambulatory Visit (INDEPENDENT_AMBULATORY_CARE_PROVIDER_SITE_OTHER): Payer: Medicare Other | Admitting: Student

## 2018-11-26 ENCOUNTER — Ambulatory Visit (HOSPITAL_COMMUNITY)
Admission: RE | Admit: 2018-11-26 | Discharge: 2018-11-26 | Disposition: A | Discharge status: Home / Self Care | Payer: Medicare Other | Source: Ambulatory Visit | Attending: Student | Admitting: Student

## 2018-11-26 ENCOUNTER — Encounter: Payer: Self-pay | Admitting: Student

## 2018-11-26 VITALS — BP 100/70 | HR 77 | Ht 71.0 in | Wt 134.0 lb

## 2018-11-26 DIAGNOSIS — Z79899 Other long term (current) drug therapy: Secondary | ICD-10-CM

## 2018-11-26 DIAGNOSIS — Z8679 Personal history of other diseases of the circulatory system: Secondary | ICD-10-CM

## 2018-11-26 DIAGNOSIS — I10 Essential (primary) hypertension: Secondary | ICD-10-CM | POA: Diagnosis not present

## 2018-11-26 DIAGNOSIS — I350 Nonrheumatic aortic (valve) stenosis: Secondary | ICD-10-CM | POA: Diagnosis not present

## 2018-11-26 DIAGNOSIS — Z952 Presence of prosthetic heart valve: Secondary | ICD-10-CM | POA: Diagnosis not present

## 2018-11-26 DIAGNOSIS — R600 Localized edema: Secondary | ICD-10-CM | POA: Diagnosis not present

## 2018-11-26 MED ORDER — FUROSEMIDE 20 MG PO TABS: 20.0000 mg | ORAL_TABLET | Freq: Two times a day (BID) | ORAL | 3 refills | Status: DC

## 2018-11-26 NOTE — Progress Notes (Signed)
*  PRELIMINARY RESULTS* Echocardiogram 2D Echocardiogram has been performed.  Stacey Drain 11/26/2018, 2:39 PM

## 2018-11-26 NOTE — Patient Instructions (Addendum)
Medication Instructions:  Your physician has recommended you make the following change in your medication:  Lasix 20 mg Two Times Daily  If you need a refill on your cardiac medications before your next appointment, please call your pharmacy.   Lab work: Your physician recommends that you return for lab work in: 2-3 Weeks   If you have labs (blood work) drawn today and your tests are completely normal, you will receive your results only by: Marland Kitchen MyChart Message (if you have MyChart) OR . A paper copy in the mail If you have any lab test that is abnormal or we need to change your treatment, we will call you to review the results.  Testing/Procedures: NONE   Follow-Up: At [facility], you and your health needs are our priority.  As part of our continuing mission to provide you with exceptional heart care, we have created designated Provider Care Teams.  These Care Teams include your primary Cardiologist (physician) and Advanced Practice Providers (APPs -  Physician Assistants and Nurse Practitioners) who all work together to provide you with the care you need, when you need it. You will need a follow up appointment in 6-8 weeks.  Please call our office 2 months in advance to schedule this appointment.  You may see Nona Dell, MD or one of the following Advanced Practice Providers on your designated Care Team:   Randall An, PA-C [facility]) . Jacolyn Reedy, PA-C [facility] Office)  Any Other Special Instructions Will Be Listed Below (If Applicable). Thank you for choosing [facility]!     Two Gram Sodium Diet 2000 mg  What is Sodium? Sodium is a mineral found naturally in many foods. The most significant source of sodium in the diet is table salt, which is about 40% sodium.  Processed, convenience, and preserved foods also contain a large amount of sodium.  The body needs only 500 mg of sodium daily to function,  A normal diet provides more than enough sodium  even if you do not use salt.  Why Limit Sodium? A build up of sodium in the body can cause thirst, increased blood pressure, shortness of breath, and water retention.  Decreasing sodium in the diet can reduce edema and risk of heart attack or stroke associated with high blood pressure.  Keep in mind that there are many other factors involved in these health problems.  Heredity, obesity, lack of exercise, cigarette smoking, stress and what you eat all play a role.  General Guidelines:  Do not add salt at the table or in cooking.  One teaspoon of salt contains over 2 grams of sodium.  Read food labels  Avoid processed and convenience foods  Ask your dietitian before eating any foods not dicussed in the menu planning guidelines  Consult your physician if you wish to use a salt substitute or a sodium containing medication such as antacids.  Limit milk and milk products to 16 oz (2 cups) per day.  Shopping Hints:  READ LABELS!! "Dietetic" does not necessarily mean low sodium.  Salt and other sodium ingredients are often added to foods during processing.   Menu Planning Guidelines Food Group Choose More Often Avoid  Beverages (see also the milk group All fruit juices, low-sodium, salt-free vegetables juices, low-sodium carbonated beverages Regular vegetable or tomato juices, commercially softened water used for drinking or cooking  Breads and Cereals Enriched white, wheat, rye and pumpernickel bread, hard rolls and dinner rolls; muffins, cornbread and waffles; most dry cereals, cooked  cereal without added salt; unsalted crackers and breadsticks; low sodium or homemade bread crumbs Bread, rolls and crackers with salted tops; quick breads; instant hot cereals; pancakes; commercial bread stuffing; self-rising flower and biscuit mixes; regular bread crumbs or cracker crumbs  Desserts and Sweets Desserts and sweets mad with mild should be within allowance Instant pudding mixes and cake mixes  Fats  Butter or margarine; vegetable oils; unsalted salad dressings, regular salad dressings limited to 1 Tbs; light, sour and heavy cream Regular salad dressings containing bacon fat, bacon bits, and salt pork; snack dips made with instant soup mixes or processed cheese; salted nuts  Fruits Most fresh, frozen and canned fruits Fruits processed with salt or sodium-containing ingredient (some dried fruits are processed with sodium sulfites        Vegetables Fresh, frozen vegetables and low- sodium canned vegetables Regular canned vegetables, sauerkraut, pickled vegetables, and others prepared in brine; frozen vegetables in sauces; vegetables seasoned with ham, bacon or salt pork  Condiments, Sauces, Miscellaneous  Salt substitute with physician's approval; pepper, herbs, spices; vinegar, lemon or lime juice; hot pepper sauce; garlic powder, onion powder, low sodium soy sauce (1 Tbs.); low sodium condiments (ketchup, chili sauce, mustard) in limited amounts (1 tsp.) fresh ground horseradish; unsalted tortilla chips, pretzels, potato chips, popcorn, salsa (1/4 cup) Any seasoning made with salt including garlic salt, celery salt, onion salt, and seasoned salt; sea salt, rock salt, kosher salt; meat tenderizers; monosodium glutamate; mustard, regular soy sauce, barbecue, sauce, chili sauce, teriyaki sauce, steak sauce, Worcestershire sauce, and most flavored vinegars; canned gravy and mixes; regular condiments; salted snack foods, olives, picles, relish, horseradish sauce, catsup   Food preparation: Try these seasonings Meats:    Pork Sage, onion Serve with applesauce  Chicken Poultry seasoning, thyme, parsley Serve with cranberry sauce  Lamb Curry powder, rosemary, garlic, thyme Serve with mint sauce or jelly  Veal Marjoram, basil Serve with current jelly, cranberry sauce  Beef Pepper, bay leaf Serve with dry mustard, unsalted chive butter  Fish Bay leaf, dill Serve with unsalted lemon butter, unsalted  parsley butter  Vegetables:    Asparagus Lemon juice   Broccoli Lemon juice   Carrots Mustard dressing parsley, mint, nutmeg, glazed with unsalted butter and sugar   Green beans Marjoram, lemon juice, nutmeg,dill seed   Tomatoes Basil, marjoram, onion   Spice /blend for [employer]"Salt Shaker" 4 tsp ground thyme 1 tsp ground sage 3 tsp ground rosemary 4 tsp ground marjoram   Test your knowledge 1. A product that says "Salt Free" may still contain sodium. True or False 2. Garlic Powder and Hot Pepper Sauce an be used as alternative seasonings.True or False 3. Processed foods have more sodium than fresh foods.  True or False 4. Canned Vegetables have less sodium than froze True or False  WAYS TO DECREASE YOUR SODIUM INTAKE 1. Avoid the use of added salt in cooking and at the table.  Table salt (and other prepared seasonings which contain salt) is probably one of the greatest sources of sodium in the diet.  Unsalted foods can gain flavor from the sweet, sour, and butter taste sensations of herbs and spices.  Instead of using salt for seasoning, try the following seasonings with the foods listed.  Remember: how you use them to enhance natural food flavors is limited only by your creativity... Allspice-Meat, fish, eggs, fruit, peas, red and yellow vegetables Almond Extract-Fruit baked goods Anise Seed-Sweet breads, fruit, carrots, beets, cottage cheese, cookies (tastes like licorice) Basil-Meat,  fish, eggs, vegetables, rice, vegetables salads, soups, sauces Bay Leaf-Meat, fish, stews, poultry Burnet-Salad, vegetables (cucumber-like flavor) Caraway Seed-Bread, cookies, cottage cheese, meat, vegetables, cheese, rice Cardamon-Baked goods, fruit, soups Celery Powder or seed-Salads, salad dressings, sauces, meatloaf, soup, bread.Do not use  celery salt Chervil-Meats, salads, fish, eggs, vegetables, cottage cheese (parsley-like flavor) Chili Power-Meatloaf, chicken cheese, corn, eggplant, egg  dishes Chives-Salads cottage cheese, egg dishes, soups, vegetables, sauces Cilantro-Salsa, casseroles Cinnamon-Baked goods, fruit, pork, lamb, chicken, carrots Cloves-Fruit, baked goods, fish, pot roast, green beans, beets, carrots Coriander-Pastry, cookies, meat, salads, cheese (lemon-orange flavor) Cumin-Meatloaf, fish,cheese, eggs, cabbage,fruit pie (caraway flavor) [employer], fruit, eggs, fish, poultry, cottage cheese, vegetables Dill Seed-Meat, cottage cheese, poultry, vegetables, fish, salads, bread Fennel Seed-Bread, cookies, apples, pork, eggs, fish, beets, cabbage, cheese, Licorice-like flavor Garlic-(buds or powder) Salads, meat, poultry, fish, bread, butter, vegetables, potatoes.Do not  use garlic salt Ginger-Fruit, vegetables, baked goods, meat, fish, poultry Horseradish Root-Meet, vegetables, butter Lemon Juice or Extract-Vegetables, fruit, tea, baked goods, fish salads Mace-Baked goods fruit, vegetables, fish, poultry (taste like nutmeg) Maple Extract-Syrups Marjoram-Meat, chicken, fish, vegetables, breads, green salads (taste like Sage) Mint-Tea, lamb, sherbet, vegetables, desserts, carrots, cabbage Mustard, Dry or Seed-Cheese, eggs, meats, vegetables, poultry Nutmeg-Baked goods, fruit, chicken, eggs, vegetables, desserts Onion Powder-Meat, fish, poultry, vegetables, cheese, eggs, bread, rice salads (Do not use   Onion salt) Orange Extract-Desserts, baked goods Oregano-Pasta, eggs, cheese, onions, pork, lamb, fish, chicken, vegetables, green salads Paprika-Meat, fish, poultry, eggs, cheese, vegetables Parsley Flakes-Butter, vegetables, meat fish, poultry, eggs, bread, salads (certain forms may   Contain sodium Pepper-Meat fish, poultry, vegetables, eggs Peppermint Extract-Desserts, baked goods Poppy Seed-Eggs, bread, cheese, fruit dressings, baked goods, noodles, vegetables, cottage  [employer], poultry, meat, fish,  cauliflower, turnips,eggs bread Saffron-Rice, bread, veal, chicken, fish, eggs Sage-Meat, fish, poultry, onions, eggplant, tomateos, pork, stews Savory-Eggs, salads, poultry, meat, rice, vegetables, soups, pork Tarragon-Meat, poultry, fish, eggs, butter, vegetables (licorice-like flavor)  Thyme-Meat, poultry, fish, eggs, vegetables, (clover-like flavor), sauces, soups Tumeric-Salads, butter, eggs, fish, rice, vegetables (saffron-like flavor) Vanilla Extract-Baked goods, candy Vinegar-Salads, vegetables, meat marinades Walnut Extract-baked goods, candy  2. Choose your Foods Wisely   The following is a list of foods to avoid which are high in sodium:  Meats-Avoid all smoked, canned, salt cured, dried and kosher meat and fish as well as Anchovies   [employer] meats:Bologna, Liverwurst, Pastrami Canned meat or fish  Marinated herring Caviar    Pepperoni Corned Beef   Pizza Dried chipped beef  Salami Frozen breaded fish or meat Salt pork Frankfurters or hot dogs  Sardines Gefilte fish   Sausage Ham (boiled ham, Proscuitto Smoked butt    spiced ham)   Spam      TV Dinners Vegetables Canned vegetables (Regular) Relish Canned mushrooms  Sauerkraut Olives    Tomato juice Pickles  Bakery and Dessert Products Canned puddings  Cream pies Cheesecake   Decorated cakes Cookies  Beverages/Juices Tomato juice, regular  Gatorade   V-8 vegetable juice, regular  Breads and Cereals Biscuit mixes   Salted potato chips, corn chips, pretzels Bread stuffing mixes  Salted crackers and rolls Pancake and waffle mixes Self-rising flour  Seasonings Accent    Meat sauces Barbecue sauce  Meat tenderizer Catsup    Monosodium glutamate (MSG) Celery salt   Onion salt Chili sauce   Prepared mustard Garlic salt   Salt, seasoned salt, sea salt Gravy mixes   Soy sauce Horseradish   Steak sauce Ketchup   Tartar  sauce Lite salt    Teriyaki sauce Marinade mixes   Worcestershire  sauce  Others Baking powder   Cocoa and cocoa mixes Baking soda   Commercial casserole mixes Candy-caramels, chocolate  Dehydrated soups    Bars, fudge,nougats  Instant rice and pasta mixes Canned broth or soup  Maraschino cherries Cheese, aged and processed cheese and cheese spreads

## 2018-12-22 ENCOUNTER — Other Ambulatory Visit (HOSPITAL_COMMUNITY)
Admission: RE | Admit: 2018-12-22 | Discharge: 2018-12-22 | Disposition: A | Discharge status: Home / Self Care | Payer: Medicare Other | Source: Ambulatory Visit | Attending: Student | Admitting: Student

## 2018-12-22 ENCOUNTER — Telehealth: Payer: Self-pay | Admitting: Student

## 2018-12-22 DIAGNOSIS — Z79899 Other long term (current) drug therapy: Secondary | ICD-10-CM | POA: Insufficient documentation

## 2018-12-22 LAB — BASIC METABOLIC PANEL
Anion gap: 7 (ref 5–15)
BUN: 14 mg/dL (ref 8–23)
CO2: 28 mmol/L (ref 22–32)
Calcium: 8.6 mg/dL — ABNORMAL LOW (ref 8.9–10.3)
Chloride: 104 mmol/L (ref 98–111)
Creatinine, Ser: 0.57 mg/dL — ABNORMAL LOW (ref 0.61–1.24)
GFR calc Af Amer: >60 mL/min (ref >60)
Glucose, Bld: 98 mg/dL (ref 70–99)
Potassium: 3.6 mmol/L (ref 3.5–5.1)
SODIUM: 139 mmol/L (ref 135–145)

## 2018-12-22 NOTE — Telephone Encounter (Signed)
Patient came into office to pick up lab work, stated he wanted B. Strader to be aware that he is still having swelling in his feet/legs. States he has not weighed himself but has picked up the protein supplements she recommended.

## 2018-12-22 NOTE — Telephone Encounter (Signed)
    Already resulted lab work. I am glad he was able to obtain Ensure. Please encourage him to follow daily weights if possible. If he does not have scales, would recommend nurse visit within the next week for weight check (on a day I am in the office if possible if case we need to adjust his Lasix dosing).   Thanks,  Ellsworth Lennox, PA-C 12/22/2018, 7:26 PM Pager: [phone]

## 2018-12-23 MED ORDER — POTASSIUM CHLORIDE CRYS ER 20 MEQ PO TBCR: 20.0000 meq | EXTENDED_RELEASE_TABLET | Freq: Every day | ORAL | 3 refills | Status: DC

## 2018-12-23 NOTE — Telephone Encounter (Signed)
-----   Message from Ellsworth Lennox, [place] sent at 12/22/2018  7:17 PM EST ----- Please let the patient know his kidney function remains stable. K+ is on the low end of normal which is likely secondary to increased Lasix dosing and would recommend starting K-dur 20 mEq daily. Please forward a copy of labs to [facility], [facility].

## 2018-12-23 NOTE — Telephone Encounter (Signed)
2/18 [id number]

## 2018-12-23 NOTE — Telephone Encounter (Signed)
Results given to patient, will start potassium 20 meq daily, copied pcp

## 2019-01-30 ENCOUNTER — Ambulatory Visit: Payer: Medicare Other | Admitting: Cardiology

## 2019-03-13 ENCOUNTER — Other Ambulatory Visit: Payer: Self-pay | Admitting: Cardiology

## 2019-03-31 ENCOUNTER — Ambulatory Visit: Payer: Medicare Other | Admitting: Cardiology

## 2019-05-26 ENCOUNTER — Other Ambulatory Visit: Payer: Self-pay | Admitting: Cardiology

## 2019-05-26 MED ORDER — CARVEDILOL 6.25 MG PO TABS: 6.2500 mg | ORAL_TABLET | Freq: Two times a day (BID) | ORAL | 3 refills | Status: DC

## 2019-05-26 MED ORDER — FUROSEMIDE 20 MG PO TABS: 20.0000 mg | ORAL_TABLET | Freq: Two times a day (BID) | ORAL | 3 refills | Status: DC

## 2019-05-26 NOTE — Telephone Encounter (Signed)
Refilled lasix and coreg

## 2019-05-26 NOTE — Telephone Encounter (Signed)
New Message     *STAT* If patient is at the pharmacy, call can be transferred to refill team.   1. Which medications need to be refilled? (please list name of each medication and dose if known)    carvedilol (COREG) 6.25 MG tablet TAKE 1 TABLET BY MOUTH TWICE DAILY WITH MEALS   furosemide (LASIX) 20 MG tablet(Expired) Take 1 tablet (20 mg total) by mouth 2 (two) times daily.     2. Which pharmacy/location (including street and city if local pharmacy) is medication to be sent to? [facility]   3. Do they need a 30 day or 90 day supply?  [place]

## 2019-06-01 ENCOUNTER — Telehealth: Payer: Self-pay | Admitting: Cardiology
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
## 2019-06-01 NOTE — Telephone Encounter (Signed)
Virtual Visit Pre-Appointment Phone [name]  "(Name), I am calling you today to discuss your upcoming appointment. We are currently trying to limit exposure to the virus that causes COVID-19 by seeing patients at home rather than in the office."  1. "What is the BEST phone number to [name] the day of the visit?" - include this in appointment notes  2. Do you have or have access to (through a family member/friend) a smartphone with video capability that we can use for your visit?" a. If yes - list this number in appt notes as cell (if different from BEST phone #) and list the appointment type as a VIDEO visit in appointment notes b. If no - list the appointment type as a PHONE visit in appointment notes  3. Confirm consent - "In the setting of the current Covid19 crisis, you are scheduled for a (phone or video) visit with your provider on (date) at (time).  Just as we do with many in-office visits, in order for you to participate in this visit, we must obtain consent.  If you'd like, I can send this to your mychart (if signed up) or email for you to review.  Otherwise, I can obtain your verbal consent now.  All virtual visits are billed to your insurance company just like a normal visit would be.  By agreeing to a virtual visit, we'd like you to understand that the technology does not allow for your provider to perform an examination, and thus may limit your provider's ability to fully assess your condition. If your provider identifies any concerns that need to be evaluated in person, we will make arrangements to do so.  Finally, though the technology is pretty good, we cannot assure that it will always work on either your or our end, and in the setting of a video visit, we may have to convert it to a phone-only visit.  In either situation, we cannot ensure that we have a secure connection.  Are you willing to proceed?" STAFF: Did the patient verbally acknowledge consent to telehealth visit? Document  YES/NO here: yes  4. Advise patient to be prepared - "Two hours prior to your appointment, go ahead and check your blood pressure, pulse, oxygen saturation, and your weight (if you have the equipment to check those) and write them all down. When your visit starts, your provider will ask you for this information. If you have an Apple Watch or Kardia device, please plan to have heart rate information ready on the day of your appointment. Please have a pen and paper handy nearby the day of the visit as well."  5. Give patient instructions for MyChart download to smartphone OR [email or web address] as below if video visit (depending on what platform provider is using)  6. Inform patient they will receive a phone [name] 15 minutes prior to their appointment time (may be from unknown caller ID) so they should be prepared to answer    TELEPHONE [name] NOTE  Benjamin [name] has been deemed a candidate for a follow-up tele-health visit to limit community exposure during the Covid-19 pandemic. I spoke with the patient via phone to ensure availability of phone/video source, confirm preferred email & phone number, and discuss instructions and expectations.  I reminded Benjamin [name] to be prepared with any vital sign and/or heart rhythm information that could potentially be obtained via home monitoring, at the time of his visit. I reminded Benjamin [name] to expect a phone [name] prior to  his visit.  Howie Ill 06/01/2019 4:12 PM   INSTRUCTIONS FOR DOWNLOADING THE MYCHART APP TO SMARTPHONE  - The patient must first make sure to have activated MyChart and know their login information - If Apple, go to [employer] and type in MyChart in the search bar and download the app. If Android, ask patient to go to [employer] and type in [place] in the search bar and download the app. The app is free but as with any other app downloads, their phone may require them to verify saved payment information or Apple/Android password.    - The patient will need to then log into the app with their MyChart username and password, and select [facility] as their healthcare provider to link the account. When it is time for your visit, go to the MyChart app, find appointments, and click Begin Video Visit. Be sure to Select Allow for your device to access the Microphone and Camera for your visit. You will then be connected, and your provider will be with you shortly.  **If they have any issues connecting, or need assistance please contact MyChart service desk [phone])**  **If using a computer, in order to ensure the best quality for their visit they will need to use either of the following Internet Browsers: [employer], or Google Chrome**  IF USING DOXIMITY or DOXY.ME - The patient will receive a link just prior to their visit by text.     FULL LENGTH CONSENT FOR TELE-HEALTH VISIT   I hereby voluntarily request, consent and authorize [facility] and its employed or contracted physicians, physician assistants, nurse practitioners or other licensed health care professionals (the Practitioner), to provide me with telemedicine health care services (the Services") as deemed necessary by the treating Practitioner. I acknowledge and consent to receive the Services by the Practitioner via telemedicine. I understand that the telemedicine visit will involve communicating with the Practitioner through live audiovisual communication technology and the disclosure of certain medical information by electronic transmission. I acknowledge that I have been given the opportunity to request an in-person assessment or other available alternative prior to the telemedicine visit and am voluntarily participating in the telemedicine visit.  I understand that I have the right to withhold or withdraw my consent to the use of telemedicine in the course of my care at any time, without affecting my right to future care or treatment, and that  the Practitioner or I may terminate the telemedicine visit at any time. I understand that I have the right to inspect all information obtained and/or recorded in the course of the telemedicine visit and may receive copies of available information for a reasonable fee.  I understand that some of the potential risks of receiving the Services via telemedicine include:   Delay or interruption in medical evaluation due to technological equipment failure or disruption;  Information transmitted may not be sufficient (e.g. poor resolution of images) to allow for appropriate medical decision making by the Practitioner; and/or   In rare instances, security protocols could fail, causing a breach of personal health information.  Furthermore, I acknowledge that it is my responsibility to provide information about my medical history, conditions and care that is complete and accurate to the best of my ability. I acknowledge that Practitioner's advice, recommendations, and/or decision may be based on factors not within their control, such as incomplete or inaccurate data provided by me or distortions of diagnostic images or specimens that may result from electronic transmissions.  I understand that the practice of medicine is not an exact science and that Practitioner makes no warranties or guarantees regarding treatment outcomes. I acknowledge that I will receive a copy of this consent concurrently upon execution via email to the email address I last provided but may also request a printed copy by calling the office of [facility].    I understand that my insurance will be billed for this visit.   I have read or had this consent read to me.  I understand the contents of this consent, which adequately explains the benefits and risks of the Services being provided via telemedicine.   I have been provided ample opportunity to ask questions regarding this consent and the Services and have had my questions answered to  my satisfaction.  I give my informed consent for the services to be provided through the use of telemedicine in my medical care  By participating in this telemedicine visit I agree to the above.  Patient doesn't have scale but he is going to go to [facility] and get his bp taken

## 2019-06-02 ENCOUNTER — Telehealth: Payer: Self-pay | Admitting: Licensed Clinical Social Worker

## 2019-06-02 ENCOUNTER — Encounter: Payer: Self-pay | Admitting: Cardiology

## 2019-06-02 ENCOUNTER — Telehealth (INDEPENDENT_AMBULATORY_CARE_PROVIDER_SITE_OTHER): Payer: Medicare Other | Admitting: Cardiology

## 2019-06-02 ENCOUNTER — Telehealth: Payer: Self-pay | Admitting: Cardiology

## 2019-06-02 VITALS — BP 118/77 | HR 64

## 2019-06-02 DIAGNOSIS — Z8679 Personal history of other diseases of the circulatory system: Secondary | ICD-10-CM

## 2019-06-02 DIAGNOSIS — R600 Localized edema: Secondary | ICD-10-CM

## 2019-06-02 DIAGNOSIS — Z953 Presence of xenogenic heart valve: Secondary | ICD-10-CM

## 2019-06-02 DIAGNOSIS — T82857A Stenosis of cardiac prosthetic devices, implants and grafts, initial encounter: Secondary | ICD-10-CM

## 2019-06-02 DIAGNOSIS — I10 Essential (primary) hypertension: Secondary | ICD-10-CM

## 2019-06-02 MED ORDER — CARVEDILOL 6.25 MG PO TABS: 6.2500 mg | ORAL_TABLET | Freq: Two times a day (BID) | ORAL | 3 refills | Status: AC

## 2019-06-02 MED ORDER — POTASSIUM CHLORIDE CRYS ER 20 MEQ PO TBCR: 20.0000 meq | EXTENDED_RELEASE_TABLET | Freq: Every day | ORAL | 3 refills | Status: AC

## 2019-06-02 MED ORDER — FUROSEMIDE 20 MG PO TABS: 20.0000 mg | ORAL_TABLET | Freq: Two times a day (BID) | ORAL | 3 refills | Status: AC

## 2019-06-02 NOTE — Telephone Encounter (Signed)
CSW referred to assist patient with obtaining a BP cuff. CSW contacted patient to inform cuff will be delivered to home. Patient grateful for support and assistance. CSW available as needed. Jackie Ayaz Sondgeroth, LCSW, CCSW-MCS [phone]  

## 2019-06-02 NOTE — Telephone Encounter (Signed)
°  Precert needed for: Echo   Location: [facility]    Date: Dec 03, 2019

## 2019-06-02 NOTE — Patient Instructions (Signed)
Medication Instructions: Your physician recommends that you continue on your current medications as directed. Please refer to the Current Medication list given to you today.   Labwork: None   Procedures/Testing: Your physician has requested that you have an echocardiogram JUST BEFORE NEXT VISIT IN 6 MONTHS. Echocardiography is a painless test that uses sound waves to create images of your heart. It provides your doctor with information about the size and shape of your heart and how well your heart's chambers and valves are working. This procedure takes approximately one hour. There are no restrictions for this procedure. .  Follow-Up: 6 months with Dr.McDowell  Any Additional Special Instructions Will Be Listed Below (If Applicable).     If you need a refill on your cardiac medications before your next appointment, please call your pharmacy.

## 2019-09-08 DIAGNOSIS — Z23 Encounter for immunization: Secondary | ICD-10-CM | POA: Diagnosis not present

## 2019-12-03 ENCOUNTER — Ambulatory Visit (HOSPITAL_COMMUNITY): Payer: Medicare Other | Attending: Cardiology

## 2019-12-07 DEATH — deceased
